# Patient Record
Sex: Female | Born: 1980 | Race: White | Hispanic: No | Marital: Married | Smoking: Never smoker
Health system: Southern US, Community
[De-identification: ages and names within clinical notes are randomized; demographics above are authoritative.]

## PROBLEM LIST (undated history)

## (undated) DIAGNOSIS — J45909 Unspecified asthma, uncomplicated: Secondary | ICD-10-CM

## (undated) DIAGNOSIS — Z87442 Personal history of urinary calculi: Secondary | ICD-10-CM

## (undated) DIAGNOSIS — E119 Type 2 diabetes mellitus without complications: Secondary | ICD-10-CM

## (undated) DIAGNOSIS — T7840XA Allergy, unspecified, initial encounter: Secondary | ICD-10-CM

## (undated) DIAGNOSIS — R519 Headache, unspecified: Secondary | ICD-10-CM

## (undated) DIAGNOSIS — F32A Depression, unspecified: Secondary | ICD-10-CM

## (undated) DIAGNOSIS — I10 Essential (primary) hypertension: Secondary | ICD-10-CM

## (undated) DIAGNOSIS — Z808 Family history of malignant neoplasm of other organs or systems: Secondary | ICD-10-CM

## (undated) DIAGNOSIS — N200 Calculus of kidney: Secondary | ICD-10-CM

## (undated) DIAGNOSIS — Z800 Family history of malignant neoplasm of digestive organs: Secondary | ICD-10-CM

## (undated) DIAGNOSIS — E669 Obesity, unspecified: Secondary | ICD-10-CM

## (undated) DIAGNOSIS — E785 Hyperlipidemia, unspecified: Secondary | ICD-10-CM

## (undated) DIAGNOSIS — O24419 Gestational diabetes mellitus in pregnancy, unspecified control: Secondary | ICD-10-CM

## (undated) DIAGNOSIS — O1490 Unspecified pre-eclampsia, unspecified trimester: Secondary | ICD-10-CM

## (undated) DIAGNOSIS — F909 Attention-deficit hyperactivity disorder, unspecified type: Secondary | ICD-10-CM

## (undated) DIAGNOSIS — F329 Major depressive disorder, single episode, unspecified: Secondary | ICD-10-CM

## (undated) DIAGNOSIS — Z803 Family history of malignant neoplasm of breast: Secondary | ICD-10-CM

## (undated) DIAGNOSIS — Z8042 Family history of malignant neoplasm of prostate: Secondary | ICD-10-CM

## (undated) HISTORY — DX: Family history of malignant neoplasm of digestive organs: Z80.0

## (undated) HISTORY — DX: Essential (primary) hypertension: I10

## (undated) HISTORY — DX: Family history of malignant neoplasm of prostate: Z80.42

## (undated) HISTORY — DX: Major depressive disorder, single episode, unspecified: F32.9

## (undated) HISTORY — PX: EYE SURGERY: SHX253

## (undated) HISTORY — DX: Hyperlipidemia, unspecified: E78.5

## (undated) HISTORY — DX: Family history of malignant neoplasm of other organs or systems: Z80.8

## (undated) HISTORY — DX: Depression, unspecified: F32.A

## (undated) HISTORY — DX: Type 2 diabetes mellitus without complications: E11.9

## (undated) HISTORY — DX: Family history of malignant neoplasm of breast: Z80.3

## (undated) HISTORY — DX: Obesity, unspecified: E66.9

## (undated) HISTORY — DX: Allergy, unspecified, initial encounter: T78.40XA

## (undated) HISTORY — DX: Calculus of kidney: N20.0

## (undated) HISTORY — DX: Unspecified asthma, uncomplicated: J45.909

## (undated) HISTORY — PX: TOOTH EXTRACTION: SUR596

---

## 2015-10-25 DIAGNOSIS — Z803 Family history of malignant neoplasm of breast: Secondary | ICD-10-CM | POA: Insufficient documentation

## 2015-10-25 DIAGNOSIS — Z8371 Family history of colonic polyps: Secondary | ICD-10-CM | POA: Insufficient documentation

## 2015-10-25 DIAGNOSIS — Z8632 Personal history of gestational diabetes: Secondary | ICD-10-CM | POA: Insufficient documentation

## 2015-10-25 DIAGNOSIS — Z8709 Personal history of other diseases of the respiratory system: Secondary | ICD-10-CM | POA: Insufficient documentation

## 2017-05-07 DIAGNOSIS — R4589 Other symptoms and signs involving emotional state: Secondary | ICD-10-CM | POA: Insufficient documentation

## 2017-05-07 DIAGNOSIS — F329 Major depressive disorder, single episode, unspecified: Secondary | ICD-10-CM

## 2018-05-01 ENCOUNTER — Ambulatory Visit (INDEPENDENT_AMBULATORY_CARE_PROVIDER_SITE_OTHER): Payer: 59 | Admitting: Family Medicine

## 2018-05-01 ENCOUNTER — Encounter: Payer: Self-pay | Admitting: Family Medicine

## 2018-05-01 VITALS — BP 136/86 | HR 64 | Temp 98.2°F | Ht 67.0 in | Wt 225.4 lb

## 2018-05-01 DIAGNOSIS — Z0000 Encounter for general adult medical examination without abnormal findings: Secondary | ICD-10-CM

## 2018-05-01 DIAGNOSIS — F329 Major depressive disorder, single episode, unspecified: Secondary | ICD-10-CM | POA: Diagnosis not present

## 2018-05-01 DIAGNOSIS — Z114 Encounter for screening for human immunodeficiency virus [HIV]: Secondary | ICD-10-CM | POA: Diagnosis not present

## 2018-05-01 DIAGNOSIS — Z975 Presence of (intrauterine) contraceptive device: Secondary | ICD-10-CM | POA: Insufficient documentation

## 2018-05-01 DIAGNOSIS — R4589 Other symptoms and signs involving emotional state: Secondary | ICD-10-CM

## 2018-05-01 LAB — CBC WITH DIFFERENTIAL/PLATELET
Basophils Absolute: 0 10*3/uL (ref 0.0–0.1)
Basophils Relative: 0.5 % (ref 0.0–3.0)
EOS ABS: 0.1 10*3/uL (ref 0.0–0.7)
Eosinophils Relative: 1.6 % (ref 0.0–5.0)
HEMATOCRIT: 43.8 % (ref 36.0–46.0)
HEMOGLOBIN: 14.6 g/dL (ref 12.0–15.0)
LYMPHS PCT: 33.3 % (ref 12.0–46.0)
Lymphs Abs: 2.9 10*3/uL (ref 0.7–4.0)
MCHC: 33.5 g/dL (ref 30.0–36.0)
MCV: 84.1 fl (ref 78.0–100.0)
MONO ABS: 0.4 10*3/uL (ref 0.1–1.0)
Monocytes Relative: 4.5 % (ref 3.0–12.0)
Neutro Abs: 5.3 10*3/uL (ref 1.4–7.7)
Neutrophils Relative %: 60.1 % (ref 43.0–77.0)
Platelets: 248 10*3/uL (ref 150.0–400.0)
RBC: 5.21 Mil/uL — AB (ref 3.87–5.11)
RDW: 13.2 % (ref 11.5–15.5)
WBC: 8.8 10*3/uL (ref 4.0–10.5)

## 2018-05-01 LAB — COMPREHENSIVE METABOLIC PANEL
ALBUMIN: 4.6 g/dL (ref 3.5–5.2)
ALK PHOS: 76 U/L (ref 39–117)
ALT: 16 U/L (ref 0–35)
AST: 15 U/L (ref 0–37)
BUN: 13 mg/dL (ref 6–23)
CALCIUM: 9.4 mg/dL (ref 8.4–10.5)
CO2: 30 mEq/L (ref 19–32)
CREATININE: 0.7 mg/dL (ref 0.40–1.20)
Chloride: 105 mEq/L (ref 96–112)
GFR: 98.82 mL/min (ref >60.00)
Glucose, Bld: 88 mg/dL (ref 70–99)
Potassium: 3.8 mEq/L (ref 3.5–5.1)
SODIUM: 141 meq/L (ref 135–145)
TOTAL PROTEIN: 7.5 g/dL (ref 6.0–8.3)
Total Bilirubin: 0.6 mg/dL (ref 0.2–1.2)

## 2018-05-01 LAB — LIPID PANEL
CHOLESTEROL: 210 mg/dL — AB (ref 0–200)
HDL: 37.8 mg/dL — AB (ref >39.00)
LDL Cholesterol: 140 mg/dL — ABNORMAL HIGH (ref 0–99)
NonHDL: 172.35
TRIGLYCERIDES: 160 mg/dL — AB (ref 0.0–149.0)
Total CHOL/HDL Ratio: 6
VLDL: 32 mg/dL (ref 0.0–40.0)

## 2018-05-01 LAB — TSH: TSH: 2.33 u[IU]/mL (ref 0.35–4.50)

## 2018-05-01 MED ORDER — SERTRALINE HCL 50 MG PO TABS: 50.0000 mg | ORAL_TABLET | Freq: Every day | ORAL | 3 refills | Status: DC

## 2018-05-01 MED ORDER — MONTELUKAST SODIUM 10 MG PO TABS: 10.0000 mg | ORAL_TABLET | Freq: Every day | ORAL | 3 refills | Status: DC

## 2018-05-01 MED FILL — SERTRALINE HCL 50 MG TABLET: 50 | 90 days supply | Qty: 90 | Fill #0

## 2018-05-01 MED FILL — MONTELUKAST SOD 10 MG TAB: 10 | 90 days supply | Qty: 90 | Fill #0

## 2018-05-01 NOTE — Patient Instructions (Signed)
Health Maintenance Due  Topic Date Due  . PAP SMEAR -due in October 2019 06/09/2016  . INFLUENZA VACCINE -will get through [facility] this month 03/26/2018

## 2018-05-01 NOTE — Progress Notes (Addendum)
Patient: Stephanie Fields MRN: [id number] DOB: [birth year] PCP: Orma Flaming, MD     Subjective:  Chief Complaint  Patient presents with  . Establish Care    HPI: The patient is a 37 y.o. female who presents today for annual exam. She denies any changes to past medical history. There have been no recent hospitalizations. They are following a well balanced diet and exercise plan. Weight has been stable. No complaints today. She is working on exercise and diet as she would like to lose weight.   Depression: history of depression. She was diagnosed over a year ago. Did well on Wellbutrin until had adverse side effects with hives. Was changed to zoloft and doing well on current dose. Very well controlled. NO desire to change. Needing refills. Trying to work on exercise.  Immunization History  Administered Date(s) Administered  . Influenza-Unspecified 05/22/2016, 05/15/2017  . Tdap 10/24/2012     Colonoscopy: age 64 Hx of adenoma's in her dad.  Mammogram: needs at 38.  Pap smear: 05/2013. Normal no hx of abnormal pap smear.  Tdap: 2014  Review of Systems  Constitutional: Positive for fatigue. Negative for chills and fever.  HENT: Negative for dental problem, ear pain, hearing loss and trouble swallowing.   Eyes: Negative for visual disturbance.  Respiratory: Negative for cough, chest tightness and shortness of breath.   Cardiovascular: Negative for chest pain, palpitations and leg swelling.  Gastrointestinal: Negative for abdominal pain, blood in stool, diarrhea and nausea.  Endocrine: Negative for cold intolerance, polydipsia, polyphagia and polyuria.  Genitourinary: Negative for dysuria and hematuria.  Musculoskeletal: Negative for arthralgias.  Skin: Negative for rash.  Neurological: Negative for dizziness and headaches.  Psychiatric/Behavioral: Negative for dysphoric mood and sleep disturbance. The patient is not nervous/anxious.     Allergies Patient is allergic to  wellbutrin [bupropion].  Past Medical History Patient  has a past medical history of Depression.  Surgical History Patient  has a past surgical history that includes Tooth extraction.  Family History Pateint's family history includes Alcohol abuse in her maternal grandfather and mother; Alzheimer's disease in her paternal grandfather; Arthritis in her paternal grandmother; COPD in her mother; Cancer in her father, maternal aunt, maternal aunt, and maternal grandmother; Depression in her brother, brother, father, mother, sister, and sister; Diabetes in her father, maternal grandfather, and mother; Early death in her mother; Hearing loss in her father and paternal grandmother; Heart attack in her maternal grandfather and paternal grandmother; Heart disease in her maternal grandfather, maternal grandmother, paternal grandfather, and paternal grandmother; Hyperlipidemia in her father, maternal grandfather, mother, paternal grandfather, and paternal grandmother; Hypertension in her father, maternal grandfather, and mother; Hypothyroidism in her mother; Learning disabilities in her daughter; Mental illness in her mother.  Social History Patient  reports that she has never smoked. She has never used smokeless tobacco. She reports that she drinks alcohol.    Objective: Vitals:   05/01/18 1317  BP: 136/86  Pulse: 64  Temp: 98.2 F (36.8 C)  TempSrc: Oral  SpO2: 95%  Weight: 225 lb 6.4 oz (102.2 kg)  Height: 5\' 7"  (1.702 m)    Body mass index is 35.3 kg/m.  Physical Exam  Constitutional: She is oriented to person, place, and time. She appears well-developed and well-nourished.  HENT:  Right Ear: External ear normal.  Left Ear: External ear normal.  Mouth/Throat: Oropharynx is clear and moist. No oropharyngeal exudate.  Tm pearly with light reflex bilaterally   Eyes: Pupils are equal, round, and reactive  to light. Conjunctivae and EOM are normal.  Neck: Normal range of motion. Neck supple.  No thyromegaly present.  Cardiovascular: Normal rate, regular rhythm, normal heart sounds and intact distal pulses.  No murmur heard. Pulmonary/Chest: Effort normal and breath sounds normal.  Abdominal: Soft. Bowel sounds are normal. She exhibits no distension. There is no tenderness.  Lymphadenopathy:    She has no cervical adenopathy.  Neurological: She is alert and oriented to person, place, and time. She displays normal reflexes. No cranial nerve deficit. Coordination normal.  Skin: Skin is warm and dry. No rash noted.  Psychiatric: She has a normal mood and affect. Her behavior is normal.  Vitals reviewed.      Assessment/plan: 1. Annual physical exam utd on health maintenance except for pap smear. Routine labs today. Will go to gyn as she is due for new IUD. Can do her pap and replace IUD. mmg and cscope at age 64 years. Wears sunscreen and does skin checks. Working on weight loss and exercise. F/u in one year or as needed.   - Comprehensive metabolic panel - CBC with Differential/Platelet - TSH - Lipid panel  2. Encounter for screening for HIV  - HIV antibody  3. Depressed mood Well controlled on her zoloft. Continue current medication. Refills given. F/u in one year or as needed.      Return in about 1 year (around 05/02/2019).    Orma Flaming, MD [place]  05/01/2018

## 2018-05-02 LAB — HIV ANTIBODY (ROUTINE TESTING W REFLEX): HIV 1&2 Ab, 4th Generation: NONREACTIVE

## 2018-08-04 MED FILL — MONTELUKAST SOD 10 MG TAB: 10 | 90 days supply | Qty: 90 | Fill #1

## 2018-08-04 MED FILL — SERTRALINE HCL 50 MG TABLET: 50 | 90 days supply | Qty: 90 | Fill #1

## 2018-11-10 MED FILL — MONTELUKAST SOD 10 MG TAB: 10 | 90 days supply | Qty: 90 | Fill #2

## 2018-11-10 MED FILL — SERTRALINE HCL 50 MG TABLET: 50 | 90 days supply | Qty: 90 | Fill #2

## 2018-11-13 ENCOUNTER — Other Ambulatory Visit: Payer: Self-pay | Admitting: Family Medicine

## 2018-11-13 MED ORDER — OSELTAMIVIR PHOSPHATE 75 MG PO CAPS: 75.0000 mg | ORAL_CAPSULE | Freq: Every day | ORAL | 0 refills | Status: AC

## 2018-11-13 MED FILL — OSELTAMIVIR PHOSPHATE 75 MG: 75 | 10 days supply | Qty: 10 | Fill #0

## 2018-11-13 NOTE — Progress Notes (Signed)
Son tested positive for flu B. Symptom onset <24 hours. Treating prophylactically per patient request.   Orma Flaming, MD [place]

## 2019-02-15 MED FILL — MONTELUKAST SOD 10 MG TAB: 10 | 90 days supply | Qty: 90 | Fill #3

## 2019-02-15 MED FILL — SERTRALINE HCL 50 MG TABLET: 50 | 90 days supply | Qty: 90 | Fill #3

## 2019-05-05 ENCOUNTER — Encounter: Payer: 59 | Admitting: Family Medicine

## 2019-05-14 ENCOUNTER — Other Ambulatory Visit: Payer: Self-pay

## 2019-05-14 ENCOUNTER — Encounter: Payer: Self-pay | Admitting: Family Medicine

## 2019-05-14 ENCOUNTER — Ambulatory Visit (INDEPENDENT_AMBULATORY_CARE_PROVIDER_SITE_OTHER): Payer: 59 | Admitting: Family Medicine

## 2019-05-14 DIAGNOSIS — Z23 Encounter for immunization: Secondary | ICD-10-CM

## 2019-05-14 DIAGNOSIS — F329 Major depressive disorder, single episode, unspecified: Secondary | ICD-10-CM | POA: Diagnosis not present

## 2019-05-14 DIAGNOSIS — Z8371 Family history of colonic polyps: Secondary | ICD-10-CM | POA: Diagnosis not present

## 2019-05-14 DIAGNOSIS — R4589 Other symptoms and signs involving emotional state: Secondary | ICD-10-CM

## 2019-05-14 DIAGNOSIS — Z0000 Encounter for general adult medical examination without abnormal findings: Secondary | ICD-10-CM

## 2019-05-14 NOTE — Patient Instructions (Addendum)
GYN recommendations -dr. Sumner Boast or suzanne miller  -dr. Aloha Gell with [facility]    zoloft wean:  Would do 1/2 pill x 2-3 weeks then every other day for 2 weeks then stop.  If don't do well then go back on, but hopefully you can stop.   SO FUN TO SEE YOU!!!    Preventive Care 61-40 Years Old, Female Preventive care refers to visits with your health care provider and lifestyle choices that can promote health and wellness. This includes:  A yearly physical exam. This may also be called an annual well check.  Regular dental visits and eye exams.  Immunizations.  Screening for certain conditions.  Healthy lifestyle choices, such as eating a healthy diet, getting regular exercise, not using drugs or products that contain nicotine and tobacco, and limiting alcohol use. What can I expect for my preventive care visit? Physical exam Your health care provider will check your:  Height and weight. This may be used to calculate body mass index (BMI), which tells if you are at a healthy weight.  Heart rate and blood pressure.  Skin for abnormal spots. Counseling Your health care provider may ask you questions about your:  Alcohol, tobacco, and drug use.  Emotional well-being.  Home and relationship well-being.  Sexual activity.  Eating habits.  Work and work Statistician.  Method of birth control.  Menstrual cycle.  Pregnancy history. What immunizations do I need?  Influenza (flu) vaccine  This is recommended every year. Tetanus, diphtheria, and pertussis (Tdap) vaccine  You may need a Td booster every 10 years. Varicella (chickenpox) vaccine  You may need this if you have not been vaccinated. Zoster (shingles) vaccine  You may need this after age 20. Measles, mumps, and rubella (MMR) vaccine  You may need at least one dose of MMR if you were born in 1957 or later. You may also need a second dose. Pneumococcal conjugate (PCV13) vaccine  You  may need this if you have certain conditions and were not previously vaccinated. Pneumococcal polysaccharide (PPSV23) vaccine  You may need one or two doses if you smoke cigarettes or if you have certain conditions. Meningococcal conjugate (MenACWY) vaccine  You may need this if you have certain conditions. Hepatitis A vaccine  You may need this if you have certain conditions or if you travel or work in places where you may be exposed to hepatitis A. Hepatitis B vaccine  You may need this if you have certain conditions or if you travel or work in places where you may be exposed to hepatitis B. Haemophilus influenzae type b (Hib) vaccine  You may need this if you have certain conditions. Human papillomavirus (HPV) vaccine  If recommended by your health care provider, you may need three doses over 6 months. You may receive vaccines as individual doses or as more than one vaccine together in one shot (combination vaccines). Talk with your health care provider about the risks and benefits of combination vaccines. What tests do I need? Blood tests  Lipid and cholesterol levels. These may be checked every 5 years, or more frequently if you are over 30 years old.  Hepatitis C test.  Hepatitis B test. Screening  Lung cancer screening. You may have this screening every year starting at age 61 if you have a 30-pack-year history of smoking and currently smoke or have quit within the past 15 years.  Colorectal cancer screening. All adults should have this screening starting at age 12 and continuing  until age 46. Your health care provider may recommend screening at age 45 if you are at increased risk. You will have tests every 1-10 years, depending on your results and the type of screening test.  Diabetes screening. This is done by checking your blood sugar (glucose) after you have not eaten for a while (fasting). You may have this done every 1-3 years.  Mammogram. This may be done every 1-2  years. Talk with your health care provider about when you should start having regular mammograms. This may depend on whether you have a family history of breast cancer.  BRCA-related cancer screening. This may be done if you have a family history of breast, ovarian, tubal, or peritoneal cancers.  Pelvic exam and Pap test. This may be done every 3 years starting at age 54. Starting at age 81, this may be done every 5 years if you have a Pap test in combination with an HPV test. Other tests  Sexually transmitted disease (STD) testing.  Bone density scan. This is done to screen for osteoporosis. You may have this scan if you are at high risk for osteoporosis. Follow these instructions at home: Eating and drinking  Eat a diet that includes fresh fruits and vegetables, whole grains, lean protein, and low-fat dairy.  Take vitamin and mineral supplements as recommended by your health care provider.  Do not drink alcohol if: ? Your health care provider tells you not to drink. ? You are pregnant, may be pregnant, or are planning to become pregnant.  If you drink alcohol: ? Limit how much you have to 0-1 drink a day. ? Be aware of how much alcohol is in your drink. In the [place] one drink equals one 12 oz bottle of beer (355 mL), one 5 oz glass of wine (148 mL), or one 1 oz glass of hard liquor (44 mL). Lifestyle  Take daily care of your teeth and gums.  Stay active. Exercise for at least 30 minutes on 5 or more days each week.  Do not use any products that contain nicotine or tobacco, such as cigarettes, e-cigarettes, and chewing tobacco. If you need help quitting, ask your health care provider.  If you are sexually active, practice safe sex. Use a condom or other form of birth control (contraception) in order to prevent pregnancy and STIs (sexually transmitted infections).  If told by your health care provider, take low-dose aspirin daily starting at age 18. What's next?  Visit your  health care provider once a year for a well check visit.  Ask your health care provider how often you should have your eyes and teeth checked.  Stay up to date on all vaccines. This information is not intended to replace advice given to you by your health care provider. Make sure you discuss any questions you have with your health care provider. Document Released: 09/08/2015 Document Revised: 04/23/2018 Document Reviewed: 04/23/2018 Elsevier Patient Education  2020 [employer].

## 2019-05-14 NOTE — Progress Notes (Signed)
Patient: Stephanie Fields MRN: [id number] DOB: [birth year] PCP: Orma Flaming, MD     Subjective:    Chief Complaint  Patient presents with  . Annual Exam    HPI: The patient is a 38 y.o. female who presents today for annual exam. She denies any changes to past medical history. There have been no recent hospitalizations. They are following a well balanced diet and exercise plan. Weight has been stable. No complaints today.   Family history of tubular adenomas in her father when he was in his 21s. She is due for colonoscopy this year. Referral will be placed.   Depression: currently on zoloft and is well controlled. She is interested in weaning   Immunization History  Administered Date(s) Administered  . Influenza,inj,Quad PF,6+ Mos 05/14/2019  . Influenza-Unspecified 05/22/2016, 05/15/2017  . Tdap 10/24/2012   Colonoscopy: needs cscope ordered today  Mammogram: number given  Pap smear: will do with mirena.  Tdap: 12/08/2012 Flu shot: today    Review of Systems  Constitutional: Negative for chills, fatigue and fever.  HENT: Negative for congestion, dental problem, ear pain, hearing loss, postnasal drip, rhinorrhea, sore throat and trouble swallowing.   Eyes: Negative for visual disturbance.  Respiratory: Negative for cough, chest tightness and shortness of breath.   Cardiovascular: Negative for chest pain, palpitations and leg swelling.  Gastrointestinal: Negative for abdominal pain, blood in stool, diarrhea, nausea and vomiting.  Endocrine: Negative for cold intolerance, polydipsia, polyphagia and polyuria.  Genitourinary: Negative for dysuria, frequency, hematuria and urgency.  Musculoskeletal: Negative for arthralgias, back pain, myalgias and neck pain.  Skin: Negative for rash.  Neurological: Negative for dizziness and headaches.  Psychiatric/Behavioral: Negative for dysphoric mood and sleep disturbance. The patient is not nervous/anxious.      Allergies Patient is allergic to bupropion.  Past Medical History Patient  has a past medical history of Depression.  Surgical History Patient  has a past surgical history that includes Tooth extraction.  Family History Pateint's family history includes Alcohol abuse in her maternal grandfather and mother; Alzheimer's disease in her paternal grandfather; Arthritis in her paternal grandmother; COPD in her mother; Cancer in her father, maternal aunt, maternal aunt, and maternal grandmother; Depression in her brother, brother, father, mother, sister, and sister; Diabetes in her father, maternal grandfather, and mother; Early death in her mother; Hearing loss in her father and paternal grandmother; Heart attack in her maternal grandfather and paternal grandmother; Heart disease in her maternal grandfather, maternal grandmother, paternal grandfather, and paternal grandmother; Hyperlipidemia in her father, maternal grandfather, mother, paternal grandfather, and paternal grandmother; Hypertension in her father, maternal grandfather, and mother; Hypothyroidism in her mother; Learning disabilities in her daughter; Mental illness in her mother.  Social History Patient  reports that she has never smoked. She has never used smokeless tobacco. She reports current alcohol use.    Objective: Vitals:   05/14/19 1404  BP: 124/78  Pulse: 65  Temp: 98.3 F (36.8 C)  TempSrc: Skin  SpO2: 98%  Weight: 226 lb (102.5 kg)  Height: 5\' 7"  (1.702 m)    Body mass index is 35.4 kg/m.  Physical Exam Vitals signs reviewed.  Constitutional:      Appearance: Normal appearance. She is well-developed. She is obese.  HENT:     Head: Normocephalic and atraumatic.     Right Ear: Tympanic membrane, ear canal and external ear normal.     Left Ear: Tympanic membrane, ear canal and external ear normal.     Nose: Nose  normal.     Mouth/Throat:     Mouth: Mucous membranes are moist.  Eyes:     Extraocular  Movements: Extraocular movements intact.     Conjunctiva/sclera: Conjunctivae normal.     Pupils: Pupils are equal, round, and reactive to light.  Neck:     Musculoskeletal: Normal range of motion and neck supple.     Thyroid: No thyromegaly.  Cardiovascular:     Rate and Rhythm: Normal rate and regular rhythm.     Pulses: Normal pulses.     Heart sounds: Normal heart sounds. No murmur.  Pulmonary:     Effort: Pulmonary effort is normal.     Breath sounds: Normal breath sounds.  Abdominal:     General: Abdomen is flat. Bowel sounds are normal. There is no distension.     Palpations: Abdomen is soft.     Tenderness: There is no abdominal tenderness.  Musculoskeletal: Normal range of motion.  Lymphadenopathy:     Cervical: No cervical adenopathy.  Skin:    General: Skin is warm and dry.     Capillary Refill: Capillary refill takes less than 2 seconds.     Findings: No rash.  Neurological:     General: No focal deficit present.     Mental Status: She is alert and oriented to person, place, and time.     Cranial Nerves: No cranial nerve deficit.     Coordination: Coordination normal.     Deep Tendon Reflexes: Reflexes normal.  Psychiatric:        Mood and Affect: Mood normal.        Behavior: Behavior normal.        Depression screen [facility] 2/9 05/14/2019 05/01/2018  Decreased Interest 0 0  Down, Depressed, Hopeless 0 0  PHQ - 2 Score 0 0  Altered sleeping 1 0  Tired, decreased energy 1 1  Change in appetite 1 3  Feeling bad or failure about yourself  0 0  Trouble concentrating 0 0  Moving slowly or fidgety/restless 0 0  Suicidal thoughts 0 0  PHQ-9 Score 3 4  Difficult doing work/chores Not difficult at all Not difficult at all    Assessment/plan: 1. Annual physical exam Routine labs today. Continue diet/weight loss and exercise. She will get pap and mirena with gyn. Flu shot today and  mmg number given. Referral for scope done as well with family hx of numerous adenoma  polyps in dad at age 54. F/u in one year or as needed.  Patient counseling [x]    Nutrition: Stressed importance of moderation in sodium/caffeine intake, saturated fat and cholesterol, caloric balance, sufficient intake of fresh fruits, vegetables, fiber, calcium, iron, and 1 mg of folate supplement per day (for females capable of pregnancy).  [x]    Stressed the importance of regular exercise.   []    Substance Abuse: Discussed cessation/primary prevention of tobacco, alcohol, or other drug use; driving or other dangerous activities under the influence; availability of treatment for abuse.   [x]    Injury prevention: Discussed safety belts, safety helmets, smoke detector, smoking near bedding or upholstery.   [x]    Sexuality: Discussed sexually transmitted diseases, partner selection, use of condoms, avoidance of unintended pregnancy  and contraceptive alternatives.  [x]    Dental health: Discussed importance of regular tooth brushing, flossing, and dental visits.  [x]    Health maintenance and immunizations reviewed. Please refer to Health maintenance section.    - CBC with Differential/Platelet - Comprehensive metabolic panel - Lipid panel -  TSH - VITAMIN D 25 Hydroxy (Vit-D Deficiency, Fractures)  2. Family history of polyps in the colon  - Ambulatory referral to Gastroenterology  3. Depressed mood Will wean off the zoloft. She was put on after death of her mother and feels like she is doing fine. Discussed appropriate wean with her. Let me know if not doing well after wean or can restart if feels like her depression symptoms are not controlled. Sounds like this was more situational.   4. Need for immunization against influenza  - Flu Vaccine QUAD 36+ mos IM    Return in about 1 year (around 05/13/2020).    Orma Flaming, MD [place]  05/14/2019

## 2019-05-15 LAB — CBC WITH DIFFERENTIAL/PLATELET
Absolute Monocytes: 408 cells/uL (ref 200–950)
Basophils Absolute: 94 cells/uL (ref 0–200)
Basophils Relative: 1.1 %
Eosinophils Absolute: 213 cells/uL (ref 15–500)
Eosinophils Relative: 2.5 %
HCT: 43.1 % (ref 35.0–45.0)
Hemoglobin: 14.2 g/dL (ref 11.7–15.5)
Lymphs Abs: 2839 cells/uL (ref 850–3900)
MCH: 28 pg (ref 27.0–33.0)
MCHC: 32.9 g/dL (ref 32.0–36.0)
MCV: 85 fL (ref 80.0–100.0)
MPV: 11.1 fL (ref 7.5–12.5)
Monocytes Relative: 4.8 %
Neutro Abs: 4947 cells/uL (ref 1500–7800)
Neutrophils Relative %: 58.2 %
Platelets: 258 10*3/uL (ref 140–400)
RBC: 5.07 10*6/uL (ref 3.80–5.10)
RDW: 12.6 % (ref 11.0–15.0)
Total Lymphocyte: 33.4 %
WBC: 8.5 10*3/uL (ref 3.8–10.8)

## 2019-05-15 LAB — COMPREHENSIVE METABOLIC PANEL
AG Ratio: 1.9 (calc) (ref 1.0–2.5)
ALT: 11 U/L (ref 6–29)
AST: 12 U/L (ref 10–30)
Albumin: 4.5 g/dL (ref 3.6–5.1)
Alkaline phosphatase (APISO): 76 U/L (ref 31–125)
BUN: 15 mg/dL (ref 7–25)
CO2: 23 mmol/L (ref 20–32)
Calcium: 9.5 mg/dL (ref 8.6–10.2)
Chloride: 105 mmol/L (ref 98–110)
Creat: 0.74 mg/dL (ref 0.50–1.10)
Globulin: 2.4 g/dL (calc) (ref 1.9–3.7)
Glucose, Bld: 84 mg/dL (ref 65–99)
Potassium: 3.7 mmol/L (ref 3.5–5.3)
Sodium: 142 mmol/L (ref 135–146)
Total Bilirubin: 0.6 mg/dL (ref 0.2–1.2)
Total Protein: 6.9 g/dL (ref 6.1–8.1)

## 2019-05-15 LAB — TSH: TSH: 1.18 mIU/L

## 2019-05-15 LAB — LIPID PANEL
Cholesterol: 223 mg/dL — ABNORMAL HIGH (ref <200)
HDL: 38 mg/dL — ABNORMAL LOW (ref >=50)
LDL Cholesterol (Calc): 153 mg/dL (calc) — ABNORMAL HIGH
Non-HDL Cholesterol (Calc): 185 mg/dL (calc) — ABNORMAL HIGH (ref <130)
Total CHOL/HDL Ratio: 5.9 (calc) — ABNORMAL HIGH (ref <5.0)
Triglycerides: 182 mg/dL — ABNORMAL HIGH (ref <150)

## 2019-05-15 LAB — VITAMIN D 25 HYDROXY (VIT D DEFICIENCY, FRACTURES): Vit D, 25-Hydroxy: 25 ng/mL — ABNORMAL LOW (ref 30–100)

## 2019-05-17 ENCOUNTER — Encounter: Payer: Self-pay | Admitting: Gastroenterology

## 2019-05-17 ENCOUNTER — Other Ambulatory Visit: Payer: Self-pay | Admitting: Family Medicine

## 2019-05-17 DIAGNOSIS — E559 Vitamin D deficiency, unspecified: Secondary | ICD-10-CM | POA: Insufficient documentation

## 2019-05-17 MED ORDER — VITAMIN D (ERGOCALCIFEROL) 1.25 MG (50000 UNIT) PO CAPS: ORAL_CAPSULE | ORAL | 0 refills | Status: DC

## 2019-05-17 MED FILL — VIT D2 1.25 MG (50,000 UNIT: 1.25 MG | 56 days supply | Qty: 8 | Fill #0

## 2019-05-25 ENCOUNTER — Other Ambulatory Visit: Payer: Self-pay

## 2019-05-25 ENCOUNTER — Encounter: Payer: Self-pay | Admitting: Family Medicine

## 2019-05-25 MED ORDER — MONTELUKAST SODIUM 10 MG PO TABS: 10.0000 mg | ORAL_TABLET | Freq: Every day | ORAL | 3 refills | Status: DC

## 2019-06-16 ENCOUNTER — Ambulatory Visit (INDEPENDENT_AMBULATORY_CARE_PROVIDER_SITE_OTHER): Payer: 59 | Admitting: Gastroenterology

## 2019-06-16 ENCOUNTER — Encounter: Payer: Self-pay | Admitting: Gastroenterology

## 2019-06-16 VITALS — BP 128/82 | HR 73 | Temp 98.5°F | Ht 67.0 in | Wt 228.0 lb

## 2019-06-16 DIAGNOSIS — Z8371 Family history of colonic polyps: Secondary | ICD-10-CM

## 2019-06-16 DIAGNOSIS — Z1159 Encounter for screening for other viral diseases: Secondary | ICD-10-CM | POA: Diagnosis not present

## 2019-06-16 MED ORDER — NA SULFATE-K SULFATE-MG SULF 17.5-3.13-1.6 GM/177ML PO SOLN: 1.0000 | ORAL | 0 refills | Status: DC

## 2019-06-16 NOTE — Patient Instructions (Addendum)
It's time for a colonoscopy given your family history.  I recommend using a stool bulking agent such as Metamucil, Benefiber, or Citrucel.  Tips for colonoscopy:  - Stay well hydrated for 3-4 days prior to the exam. This reduces nausea and dehydration.  - To prevent skin/hemorrhoid irritation - prior to wiping, put A&Dointment or vaseline on the toilet paper. - Keep a towel or pad on the bed.  - Drink  64oz of clear liquids in the morning of prep day (prior to starting the prep) to be sure that there is enough fluid to flush the colon and stay hydrated!!!! This is in addition to the fluids required for preparation. - Use of a flavored hard candy, such as grape Anise Salvo, can counteract some of the flavor of the prep and may prevent some nausea.   Please call, text, or email me with any questions prior to your colonoscopy - including problems with your bowel prep. My personal cell phone is [phone].  Due to recent COVID-19 restrictions implemented by [employer] and state authorities and in an effort to keep both patients and staff as safe as possible, [place] requires COVID-19 testing prior to any scheduled endoscopic procedure. The testing center is located at [address] [place], [place] [address] in the [facility] [employer]  suite.  Your appointment has been scheduled for 8:00 am on 07-13-2019.   Please bring your insurance cards to this appointment. You will require your COVID screen 2 business days prior to your endoscopic procedure.  You are not required to quarantine after your screening.  You will only receive a phone call with the results if it is POSITIVE.  If you do not receive a call the day before your procedure you should begin your prep, if ordered, and you should report to the endo center for your procedure at your designated appointment arrival time ( one hour prior to the procedure time). There is no cost to you  for the screening on the day of the swab.  [facility] will file with your insurance company for the testing.    You may receive an automated phone call prior to your procedure or have a message in your MyChart that you have an appointment for a BP/15 at the [facility], please disregard this message.  Your testing will be at the [place] [place] location.   If you are leaving [facility] travel [place] on [address], turn left onto [address], turn night onto [address] at the 1st stop light turn right, pass the [employer] on your right and proceed to [address] (white building).

## 2019-06-16 NOTE — Progress Notes (Addendum)
Referring Provider: Orma Flaming, MD Primary Care Physician:  Orma Flaming, MD  Reason for Consultation:  Family history of colon polyps   IMPRESSION:  Family history of colon polyps (father with tubular adenomas in his 26s) Stress-induced diarrhea    - treated with Bentyl in college Single episode of nocturnal fecal incontinence 2020 Hemorrhoids with occasional rectal bleeding No prior colon cancer screening Acute bloody diarrhea attributed to likely Norwalk virus 2002 Family history of celiac (Paternal aunt and three paternal first degree cousins)  Given her family history a colonoscopy is recommended for colon cancer screening.  She has a longstanding history of intermittent diarrhea associated with stress and caffeine.  There is been a recent episode of nocturnal fecal incontinence of unclear etiology.  She also has occasional rectal bleeding that she is attributed to hemorrhoids.  I recommended adding a stool bulking agent.  Could consider repeating a trial of Bentyl if the symptoms become more severe.  We will pursue additional evaluation of the fecal incontinence beyond colonoscopy with any recurrence.   PLAN: Add a daily stool bulking agent such as Metamucil, Benefiber, or Citrucel Colonoscopy recommended given the family history of colon cancer and to confirm hemorrhoids as the cause of rectal bleeding  Please see the "Patient Instructions" section for addition details about the plan.  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 The history is obtained through the patient and review of her electronic health record.  She has borderline hyperlipidemia, vitamin D deficiency, depression, hypertension during pregnancy, gestational diabetes, and obesity.  Father with tubular adenomas in his 26s. Required annual colonoscopy. Multiple right sided polyps required hemicolectomy at age 24. He has had some  additional polyps since that time.   Mutiple polyps in her father's side of the family. Paternal uncle with metastatic pancreatic cancer in his early 74s. Mother's family with breast cancer. No other known family history of colon cancer or polyps. No family history of uterine/endometrial cancer, pancreatic cancer or gastric/stomach cancer.  Hospitalized in 2002 with acute bloody diarrhea that was likely Norwalk while living in [place]. Insurance denied colonoscopy at that time.  Symptoms resolved.   Longstanding symptoms of diarrhea related to stress and caffeine. No blood or mucous. Difficult when traveling across country. Preceeded by cramping.  Bentyl in college provided no significant improvement.  Sugar and dairy.   Ocasional sandy stools. One episode of fecal incontinence awoke with soiled bedsheets and underwear. Normally has a sense of incomplete evacuation.   Morning formed stool followed by several loose bowel movements daily. Has to rotate her body to get her BM started. Some associated rectal fullness.  Associated borborymous.   Hemorrhoids since the time of her first child. Rare associated bleeding with significant diarrhea. No pain.    Paternal aunt and three paternal first degree cousins with celiac disease and gluten intoleance.    Past Medical History:  Diagnosis Date  . Depression     Past Surgical History:  Procedure Laterality Date  . TOOTH EXTRACTION      Current Outpatient Medications  Medication Sig Dispense Refill  . levonorgestrel (MIRENA) 20 MCG/24HR IUD by Intrauterine route.    . montelukast (SINGULAIR) 10 MG tablet Take 1 tablet (10 mg total) by mouth at bedtime. 90 tablet 3  . Vitamin D, Ergocalciferol, (DRISDOL) 1.25 MG (50000 UT) CAPS capsule One capsule by mouth once a week for 8 weeks. Then take 1000IU/day 8 capsule  0   No current facility-administered medications for this visit.     Allergies as of 06/16/2019 - Review Complete 05/14/2019   Allergen Reaction Noted  . Bupropion Hives and Rash 05/07/2017    Family History  Problem Relation Age of Onset  . Early death Mother   . Alcohol abuse Mother   . COPD Mother   . Depression Mother   . Diabetes Mother   . Hyperlipidemia Mother   . Hypertension Mother   . Mental illness Mother   . Hypothyroidism Mother   . Cancer Father        prostate cancer  . Depression Father   . Diabetes Father   . Hearing loss Father   . Hyperlipidemia Father   . Hypertension Father   . Depression Sister   . Depression Brother   . Learning disabilities Daughter        Autism  . Cancer Maternal Aunt        breast ca  . Cancer Maternal Grandmother        thyroid ca, breast ca,squamous ca, basal cell ca, melanoma  . Heart disease Maternal Grandmother   . Alcohol abuse Maternal Grandfather   . Diabetes Maternal Grandfather   . Heart attack Maternal Grandfather   . Heart disease Maternal Grandfather   . Hyperlipidemia Maternal Grandfather   . Hypertension Maternal Grandfather   . Arthritis Paternal Grandmother   . Hearing loss Paternal Grandmother   . Heart attack Paternal Grandmother   . Heart disease Paternal Grandmother   . Hyperlipidemia Paternal Grandmother   . Heart disease Paternal Grandfather   . Hyperlipidemia Paternal Grandfather   . Alzheimer's disease Paternal Grandfather   . Depression Sister   . Depression Brother   . Cancer Maternal Aunt        breast ca    Social History   Socioeconomic History  . Marital status: Married    Spouse name: Not on file  . Number of children: Not on file  . Years of education: Not on file  . Highest education level: Not on file  Occupational History  . Not on file  Social Needs  . Financial resource strain: Not on file  . Food insecurity    Worry: Not on file    Inability: Not on file  . Transportation needs    Medical: Not on file    Non-medical: Not on file  Tobacco Use  . Smoking status: Never Smoker  . Smokeless  tobacco: Never Used  Substance and Sexual Activity  . Alcohol use: Yes    Comment: rarely  . Drug use: Not on file  . Sexual activity: Yes  Lifestyle  . Physical activity    Days per week: Not on file    Minutes per session: Not on file  . Stress: Not on file  Relationships  . Social Herbalist on phone: Not on file    Gets together: Not on file    Attends religious service: Not on file    Active member of club or organization: Not on file    Attends meetings of clubs or organizations: Not on file    Relationship status: Not on file  . Intimate partner violence    Fear of current or ex partner: Not on file    Emotionally abused: Not on file    Physically abused: Not on file    Forced sexual activity: Not on file  Other Topics Concern  . Not  on file  Social History Narrative  . Not on file    Review of Systems: 12 system ROS is negative except as noted above.   Physical Exam: General:   Alert,  well-nourished, pleasant and cooperative in NAD Head:  Normocephalic and atraumatic. Eyes:  Sclera clear, no icterus.   Conjunctiva pink. Ears:  Normal auditory acuity. Nose:  No deformity, discharge,  or lesions. Mouth:  No deformity or lesions.   Neck:  Supple; no masses or thyromegaly. Lungs:  Clear throughout to auscultation.   No wheezes. Heart:  Regular rate and rhythm; no murmurs. Abdomen:  Soft,nontender, nondistended, normal bowel sounds, no rebound or guarding. No hepatosplenomegaly.   Rectal:  Deferred  Msk:  Symmetrical. No boney deformities LAD: No inguinal or umbilical LAD Extremities:  No clubbing or edema. Neurologic:  Alert and  oriented x4;  grossly nonfocal Skin:  Intact without significant lesions or rashes. Psych:  Alert and cooperative. Normal mood and affect.   Lab Results: No results for input(s): WBC, HGB, HCT, PLT in the last 72 hours. BMET No results for input(s): NA, K, CL, CO2, GLUCOSE, BUN, CREATININE, CALCIUM in the last 72 hours.  LFT No results for input(s): PROT, ALBUMIN, AST, ALT, ALKPHOS, BILITOT, BILIDIR, IBILI in the last 72 hours. PT/INR No results for input(s): LABPROT, INR in the last 72 hours. Hepatitis Panel No results for input(s): HEPBSAG, HCVAB, HEPAIGM, HEPBIGM in the last 72 hours.    Studies/Results: No results found.    Kaleisha Bhargava L. Tarri Glenn, MD, MPH 06/16/2019, 1:52 PM

## 2019-06-17 MED FILL — MONTELUKAST SOD 10 MG TAB: 10 | 90 days supply | Qty: 90 | Fill #0

## 2019-06-17 MED FILL — VIT D2 1.25 MG (50,000 UNIT: 1.25 MG | 56 days supply | Qty: 8 | Fill #0

## 2019-06-17 MED FILL — SUPREP BOWEL PREP KIT: 17.5-3.13-1 | 1 days supply | Qty: 354 | Fill #0

## 2019-06-20 ENCOUNTER — Encounter: Payer: Self-pay | Admitting: Family Medicine

## 2019-06-28 ENCOUNTER — Encounter: Payer: Self-pay | Admitting: Family Medicine

## 2019-06-28 ENCOUNTER — Other Ambulatory Visit: Payer: Self-pay | Admitting: Family Medicine

## 2019-06-28 DIAGNOSIS — Z803 Family history of malignant neoplasm of breast: Secondary | ICD-10-CM

## 2019-06-30 ENCOUNTER — Telehealth: Payer: Self-pay | Admitting: Genetic Counselor

## 2019-06-30 NOTE — Telephone Encounter (Signed)
A genetic counseling appt has been scheduled for Stephanie Fields to see Clint Guy on 0000000 at 3pm. She's been made aware to arrive 15 minutes early.

## 2019-07-07 ENCOUNTER — Telehealth: Payer: Self-pay | Admitting: Genetic Counselor

## 2019-07-07 NOTE — Telephone Encounter (Signed)
Called patient regarding upcoming Webex appointment, per patient's request this is a walk-in visit.

## 2019-07-08 ENCOUNTER — Other Ambulatory Visit: Payer: Self-pay | Admitting: Genetic Counselor

## 2019-07-08 ENCOUNTER — Inpatient Hospital Stay: Payer: 59 | Attending: Genetic Counselor | Admitting: Genetic Counselor

## 2019-07-08 ENCOUNTER — Other Ambulatory Visit: Payer: Self-pay

## 2019-07-08 ENCOUNTER — Inpatient Hospital Stay: Payer: 59

## 2019-07-08 DIAGNOSIS — Z8371 Family history of colonic polyps: Secondary | ICD-10-CM

## 2019-07-08 DIAGNOSIS — Z8042 Family history of malignant neoplasm of prostate: Secondary | ICD-10-CM | POA: Diagnosis not present

## 2019-07-08 DIAGNOSIS — Z803 Family history of malignant neoplasm of breast: Secondary | ICD-10-CM

## 2019-07-08 DIAGNOSIS — Z808 Family history of malignant neoplasm of other organs or systems: Secondary | ICD-10-CM

## 2019-07-08 DIAGNOSIS — Z800 Family history of malignant neoplasm of digestive organs: Secondary | ICD-10-CM

## 2019-07-08 DIAGNOSIS — Z1379 Encounter for other screening for genetic and chromosomal anomalies: Secondary | ICD-10-CM

## 2019-07-09 ENCOUNTER — Encounter: Payer: Self-pay | Admitting: Genetic Counselor

## 2019-07-09 DIAGNOSIS — Z808 Family history of malignant neoplasm of other organs or systems: Secondary | ICD-10-CM | POA: Insufficient documentation

## 2019-07-09 DIAGNOSIS — Z8042 Family history of malignant neoplasm of prostate: Secondary | ICD-10-CM | POA: Insufficient documentation

## 2019-07-09 DIAGNOSIS — Z800 Family history of malignant neoplasm of digestive organs: Secondary | ICD-10-CM | POA: Insufficient documentation

## 2019-07-09 NOTE — Progress Notes (Signed)
REFERRING PROVIDER: Orma Flaming, MD [address] [place],  [place] [address]  PRIMARY PROVIDER:  Orma Flaming, MD  PRIMARY REASON FOR VISIT:  1. Family history of prostate cancer   2. Family history of polyps in the colon   3. Family history of pancreatic cancer   4. Family history of breast cancer   5. Family history of melanoma   6. Family history of basal cell carcinoma     HISTORY OF PRESENT ILLNESS:   [name], a 38 y.o. female, was seen for a [place] cancer genetics consultation at the request of Dr. Rogers Blocker due to a family history of breast, prostate, pancreatic cancer and colon polyps.  Ms. Fields presents to clinic today to discuss the possibility of a hereditary predisposition to cancer, genetic testing, and to further clarify her future cancer risks, as well as potential cancer risks for family members.    [name] is a 38 y.o. female with no personal history of cancer.    CANCER HISTORY:  Oncology History   No history exists.     RISK FACTORS:  Menarche was at age 38.  First live birth at age 30.  OCP use for approximately 8-10 years.  Ovaries intact: yes.  Hysterectomy: no.  Menopausal status: premenopausal.  HRT use: 0 years. Colonoscopy: no, scheduled next week. Mammogram within the last year: no. Number of breast biopsies: 0. Any excessive radiation exposure in the past: no  Past Medical History:  Diagnosis Date   Borderline hyperlipidemia    Depression    Family history of basal cell carcinoma    Family history of melanoma    Family history of pancreatic cancer    Family history of prostate cancer    Kidney stones    Obesity     Past Surgical History:  Procedure Laterality Date   TOOTH EXTRACTION      Social History   Socioeconomic History   Marital status: Married    Spouse name: Not on file   Number of children: Not on file   Years of education: Not on file   Highest education level: Not on  file  Occupational History   Not on file  Social Needs   Financial resource strain: Not on file   Food insecurity    Worry: Not on file    Inability: Not on file   Transportation needs    Medical: Not on file    Non-medical: Not on file  Tobacco Use   Smoking status: Never Smoker   Smokeless tobacco: Never Used  Substance and Sexual Activity   Alcohol use: Yes    Comment: rarely   Drug use: Never   Sexual activity: Yes  Lifestyle   Physical activity    Days per week: Not on file    Minutes per session: Not on file   Stress: Not on file  Relationships   Social connections    Talks on phone: Not on file    Gets together: Not on file    Attends religious service: Not on file    Active member of club or organization: Not on file    Attends meetings of clubs or organizations: Not on file    Relationship status: Not on file  Other Topics Concern   Not on file  Social History Narrative   Not on file     FAMILY HISTORY:  We obtained a detailed, 4-generation family history.  Significant diagnoses are listed below: Family History  Problem  Relation Age of Onset   Early death Mother    Alcohol abuse Mother    COPD Mother    Depression Mother    Diabetes Mother    Hyperlipidemia Mother    Hypertension Mother    Mental illness Mother    Hypothyroidism Mother    Depression Father    Diabetes Father    Hearing loss Father    Hyperlipidemia Father    Hypertension Father    Colon polyps Father 39   Prostate cancer Father 65       prostate cancer   Depression Sister    Depression Brother    Learning disabilities Daughter        Autism   Breast cancer Maternal Aunt 60       negative genetic test   Heart disease Maternal Grandmother    Breast cancer Maternal Grandmother 74       thyroid ca, breast ca,squamous ca, basal cell ca, melanoma   Basal cell carcinoma Maternal Grandmother    Melanoma Maternal Grandmother 92   Alcohol  abuse Maternal Grandfather    Diabetes Maternal Grandfather    Heart attack Maternal Grandfather    Heart disease Maternal Grandfather    Hyperlipidemia Maternal Grandfather    Hypertension Maternal Grandfather    Arthritis Paternal Grandmother    Hearing loss Paternal Grandmother    Heart attack Paternal Grandmother    Heart disease Paternal Grandmother    Hyperlipidemia Paternal Grandmother    Heart disease Paternal Grandfather    Hyperlipidemia Paternal Grandfather    Alzheimer's disease Paternal Grandfather    Depression Sister    Depression Brother    Breast cancer Maternal Aunt 61   Pancreatic cancer Paternal Uncle 14   Breast cancer Maternal Aunt 47       negative genetic test   Breast cancer Maternal Great-grandmother 11   [name] has two daughters ages 81 and 54, and two sons ages 37 and 90. She has two brothers and two sisters, all in their 55s without a diagnosis of cancer.  [name] mother died at the age of 56, without a diagnosis of cancer but with a history of abnormal mammograms and biopsies.  She has six maternal aunts and two maternal uncles. Three of her aunts have had breast cancer - one at age 51 with negative genetic testing on an [employer] 36 gene panel, one at age 109 without genetic testing, and one at age 15 with negative genetic testing on an Invitae 64 gene panel. Her maternal grandmother had breast cancer at age 67 and a recurrence later on, basal cell carcinoma, melanoma, and possibly thyroid cancer (or a thyroid nodule). Her grandmother's mother also had breast cancer at age 68.   [name]'s father had stage IIIB prostate cancer diagnosed at age 24, and a history of multiple (more than 34) colon polyps beginning at age 61. Her father has a history of having 3-5 polyps removed every couple of years, and had a hemicolectomy when he was 74 due to the polyps. He has not had genetic testing. [name] has five paternal  aunts and two paternal uncles. One of her uncles died from pancreatic cancer at age 70. Her paternal grandparents died in their early 62s without a diagnosis of cancer.  [name] is aware of previous family history of genetic testing for hereditary cancer risks in two of her maternal aunts and one of her maternal cousins, which were all negative. Her maternal ancestors are  of [place] and [place] descent, and paternal ancestors are of [place] and [place] descent. There is no reported Ashkenazi Jewish ancestry. There is no known consanguinity.  GENETIC COUNSELING ASSESSMENT: [name] is a 38 y.o. female with a family history of breast, prostate, pancreatic cancer and multiple colon polyps, which is somewhat suggestive of a hereditary cancer syndrome and predisposition to cancer. We, therefore, discussed and recommended the following at today's visit.   DISCUSSION:  We discussed that 5-10% of breast cancer is hereditary, with most cases associated with BRCA1/2.  There are other genes that can be associated with hereditary breast cancer syndromes.  These include ATM, CHEK2, PALB2, etc.  While [name] has a significant maternal family history of breast cancer, two of her affected relatives have had negative genetic testing, decreasing the likelihood for a known hereditary cancer syndrome on the maternal side of the family.  Approximately 15-20% of breast cancer familial, wherein there is more breast cancer than expected in a family but a single hereditary cause is not identified.  We discussed that there may be changes in other genes that increase the risk for cancer that we don't know about, or that we don't have the technology to detect.  It is also possible that it is not a single hereditary cause, but rather a combination of genetic and environmental factors that increases the risk for breast cancer in the family.  We also discussed that polyps in general are common, however, most  people have fewer than 5 lifetime polyps.  When an individual has 10 or more polyps we become concerned about an underlying polyposis syndrome.  The most common hereditary polyposis syndromes are caused by problems in the APC and MUTYH genes, however, more recently, mutations in the Heber and MSH3 genes have been identified in some polyposis families.  We discussed that testing and identifying a hereditary cancer syndrome is beneficial for several reasons, including knowing about other cancer risks, identifying potential screening and risk-reduction options that may be appropriate, and to understand if other family members could be at risk for cancer and allow them to undergo genetic testing.  We reviewed the characteristics, features and inheritance patterns of hereditary cancer syndromes. We also discussed genetic testing, including the appropriate family members to test, the process of testing, insurance coverage and turn-around-time for results. We discussed the implications of a negative, positive and/or variant of uncertain significant result. We recommended [name] pursue genetic testing for the [facility] CustomNext-Cancer+RNAinsight panel.   The CustomNext-Cancer+RNAinsight panel offered by [facility] includes sequencing and rearrangement analysis for up to 91 genes, which will include the following 47 genes for [name]:  APC*, ATM*, AXIN2, BARD1, BMPR1A, BRCA1*, BRCA2*, BRIP1*, CDH1*, CDK4, CDKN2A, CHEK2*, DICER1, EPCAM, GREM1, HOXB13, MEN1, MLH1*, MSH2*, MSH3, MSH6*, MUTYH*, NBN, NF1*, NF2, NTHL1, PALB2*, PMS2*, POLD1, POLE, PTEN*, RAD51C*, RAD51D*, RECQL, RET, SDHA, SDHAF2, SDHB, SDHC, SDHD, SMAD4, SMARCA4, STK11, TP53*, TSC1, TSC2, and VHL.  DNA and RNA analyses performed for * genes.   Based on [name]'s family history of cancer, she meets medical criteria for genetic testing. Despite that she meets criteria, she may still have an out of pocket cost. We discussed that if  her out of pocket cost for testing is over $100, the laboratory will call and confirm whether she wants to proceed with testing.  If the out of pocket cost of testing is less than $100 she will be billed by the genetic testing laboratory.   Based on the patient's personal  and family history, statistical models (Tyrer-Cuzick and the [place]) were used to estimate her risk of developing breast cancer. This estimates her lifetime risk of developing breast cancer to be approximately 21%. This estimation does not consider any genetic testing results.  The patient's lifetime breast cancer risk is a preliminary estimate based on available information using one of several models endorsed by the [place] (ACS). The ACS recommends consideration of breast MRI screening as an adjunct to mammography for patients at high risk (defined as 20% or greater lifetime risk). The Baker Janus model estimates her 5-year risk for breast cancer to be approximately 0.6%. Consideration for chemoprevention is recommended for women who have a 5-year breast cancer risk of 1.7% or greater.  [name] has been determined to be at high risk for breast cancer.  Therefore, we recommend that annual screening with mammography and breast MRI be performed, beginning 10 years prior to the age of breast cancer diagnosis in a relative.  We discussed that [name] should discuss her individual situation with her referring physician and determine a breast cancer screening plan with which they are both comfortable.  We will also refer [name] to the [facility].  Tyrer-Cuzick:   Baker Janus Model:    PLAN: After considering the risks, benefits, and limitations, [name] provided informed consent to pursue genetic testing and the blood sample was sent to [facility] for analysis of the CustomNext-Cancer+RNAinsight panel. Results should be available within approximately two-three weeks' time, at  which point they will be disclosed by telephone to [name], as will any additional recommendations warranted by these results. [name] will receive a summary of her genetic counseling visit and a copy of her results once available. This information will also be available in Epic.   Based on [name]'s family history, we recommended her father, who was diagnosed with prostate cancer at age 71 and has had multiple colon polyps, have genetic counseling and testing. [name] will let us know if we can be of any assistance in coordinating genetic counseling and/or testing for this family member.   [name] questions were answered to her satisfaction today. Our contact information was provided should additional questions or concerns arise. Thank you for the referral and allowing [place] to share in the care of your patient.   Clint Guy, MS, [facility] Certified Genetic Counselor [email or web address] Phone: [phone]  The patient was seen for a total of 40 minutes in face-to-face genetic counseling.  This patient was discussed with Drs. Magrinat, Lindi Adie and/or Burr Medico who agrees with the above.    _______________________________________________________________________ For Office Staff:  Number of people involved in session: 1 Was an Intern/ student involved with case: no

## 2019-07-12 ENCOUNTER — Encounter: Payer: Self-pay | Admitting: Gastroenterology

## 2019-07-13 ENCOUNTER — Other Ambulatory Visit: Payer: Self-pay | Admitting: Gastroenterology

## 2019-07-13 ENCOUNTER — Ambulatory Visit (INDEPENDENT_AMBULATORY_CARE_PROVIDER_SITE_OTHER): Payer: 59

## 2019-07-13 DIAGNOSIS — Z03818 Encounter for observation for suspected exposure to other biological agents ruled out: Secondary | ICD-10-CM | POA: Diagnosis not present

## 2019-07-13 DIAGNOSIS — Z1159 Encounter for screening for other viral diseases: Secondary | ICD-10-CM

## 2019-07-13 LAB — SARS CORONAVIRUS 2 (TAT 6-24 HRS): SARS Coronavirus 2: NEGATIVE

## 2019-07-15 ENCOUNTER — Other Ambulatory Visit: Payer: Self-pay

## 2019-07-15 ENCOUNTER — Ambulatory Visit (AMBULATORY_SURGERY_CENTER): Payer: 59 | Admitting: Gastroenterology

## 2019-07-15 ENCOUNTER — Encounter: Payer: Self-pay | Admitting: Gastroenterology

## 2019-07-15 VITALS — BP 140/92 | HR 62 | Temp 98.7°F | Resp 19 | Ht 67.0 in | Wt 228.0 lb

## 2019-07-15 DIAGNOSIS — D123 Benign neoplasm of transverse colon: Secondary | ICD-10-CM

## 2019-07-15 DIAGNOSIS — D122 Benign neoplasm of ascending colon: Secondary | ICD-10-CM | POA: Diagnosis not present

## 2019-07-15 DIAGNOSIS — Z8371 Family history of colonic polyps: Secondary | ICD-10-CM | POA: Diagnosis not present

## 2019-07-15 DIAGNOSIS — Z1211 Encounter for screening for malignant neoplasm of colon: Secondary | ICD-10-CM

## 2019-07-15 MED ORDER — SODIUM CHLORIDE 0.9 % IV SOLN: 500.0000 mL | Freq: Once | INTRAVENOUS | Status: DC

## 2019-07-15 NOTE — Patient Instructions (Signed)
Handout on polyps given   YOU HAD AN ENDOSCOPIC PROCEDURE TODAY AT THE [facility]:   Refer to the procedure report that was given to you for any specific questions about what was found during the examination.  If the procedure report does not answer your questions, please call your gastroenterologist to clarify.  If you requested that your care partner not be given the details of your procedure findings, then the procedure report has been included in a sealed envelope for you to review at your convenience later.  YOU SHOULD EXPECT: Some feelings of bloating in the abdomen. Passage of more gas than usual.  Walking can help get rid of the air that was put into your GI tract during the procedure and reduce the bloating. If you had a lower endoscopy (such as a colonoscopy or flexible sigmoidoscopy) you may notice spotting of blood in your stool or on the toilet paper. If you underwent a bowel prep for your procedure, you may not have a normal bowel movement for a few days.  Please Note:  You might notice some irritation and congestion in your nose or some drainage.  This is from the oxygen used during your procedure.  There is no need for concern and it should clear up in a day or so.  SYMPTOMS TO REPORT IMMEDIATELY:   Following lower endoscopy (colonoscopy or flexible sigmoidoscopy):  Excessive amounts of blood in the stool  Significant tenderness or worsening of abdominal pains  Swelling of the abdomen that is new, acute  Fever of 100F or higher   For urgent or emergent issues, a gastroenterologist can be reached at any hour by calling [phone].   DIET:  We do recommend a small meal at first, but then you may proceed to your regular diet.  Drink plenty of fluids but you should avoid alcoholic beverages for 24 hours.  ACTIVITY:  You should plan to take it easy for the rest of today and you should NOT DRIVE or use heavy machinery until tomorrow (because of the sedation  medicines used during the test).    FOLLOW UP: Our staff will call the number listed on your records 48-72 hours following your procedure to check on you and address any questions or concerns that you may have regarding the information given to you following your procedure. If we do not reach you, we will leave a message.  We will attempt to reach you two times.  During this call, we will ask if you have developed any symptoms of COVID 19. If you develop any symptoms (ie: fever, flu-like symptoms, shortness of breath, cough etc.) before then, please call [phone].  If you test positive for Covid 19 in the 2 weeks post procedure, please call and report this information to us.    If any biopsies were taken you will be contacted by phone or by letter within the next 1-3 weeks.  Please call us at [phone] if you have not heard about the biopsies in 3 weeks.    SIGNATURES/CONFIDENTIALITY: You and/or your care partner have signed paperwork which will be entered into your electronic medical record.  These signatures attest to the fact that that the information above on your After Visit Summary has been reviewed and is understood.  Full responsibility of the confidentiality of this discharge information lies with you and/or your care-partner. 

## 2019-07-15 NOTE — Progress Notes (Signed)
Called to room to assist during endoscopic procedure.  Patient ID and intended procedure confirmed with present staff. Received instructions for my participation in the procedure from the performing physician.  

## 2019-07-15 NOTE — Op Note (Addendum)
[facility] Patient Name: Stephanie Fields Procedure Date: 07/15/2019 1:49 PM MRN: [id number] Endoscopist: Thornton Park MD, MD Age: 38 Referring MD:  Date of Birth: 16-Jun-1981 Gender: Female Account #: [id number] Procedure:                Colonoscopy Indications:              Screening for colon cancer: Family history of colon                            polyps in distant relative(s) before age 59                           Family history of colon polyps (father with tubular                            adenomas in his 46s)                           Hemorrhoids with occasional rectal bleeding                           No prior colon cancer screening Medicines:                Monitored Anesthesia Care Procedure:                Pre-Anesthesia Assessment:                           - Prior to the procedure, a History and Physical                            was performed, and patient medications and                            allergies were reviewed. The patient's tolerance of                            previous anesthesia was also reviewed. The risks                            and benefits of the procedure and the sedation                            options and risks were discussed with the patient.                            All questions were answered, and informed consent                            was obtained. Prior Anticoagulants: The patient has                            taken no previous anticoagulant or antiplatelet  agents. ASA Grade Assessment: I - A normal, healthy                            patient. After reviewing the risks and benefits,                            the patient was deemed in satisfactory condition to                            undergo the procedure.                           After obtaining informed consent, the colonoscope                            was passed under direct vision. Throughout the                             procedure, the patient's blood pressure, pulse, and                            oxygen saturations were monitored continuously. The                            Colonoscope was introduced through the anus and                            advanced to the the terminal ileum, with                            identification of the appendiceal orifice and IC                            valve. A second forward view of the right colon was                            performed. The colonoscopy was performed without                            difficulty. The patient tolerated the procedure                            well. The quality of the bowel preparation was                            excellent. The terminal ileum, ileocecal valve,                            appendiceal orifice, and rectum were photographed. Scope In: 1:58:46 PM Scope Out: 2:14:09 PM Scope Withdrawal Time: 0 hours 11 minutes 6 seconds  Total Procedure Duration: 0 hours 15 minutes 23 seconds  Findings:                 The perianal and digital rectal examinations were  normal.                           A 3 mm polyp was found in the ascending colon. The                            polyp was flat. The polyp was removed with a cold                            snare. Resection and retrieval were complete.                            Estimated blood loss was minimal.                           A 1 mm possible polyp was found in the proximal                            transverse colon. The polyp was flat. The polyp was                            removed with a cold biopsy forceps. Resection and                            retrieval were complete. Estimated blood loss was                            minimal.                           Non-bleeding internal hemorrhoids were found.                           The exam was otherwise without abnormality on                            direct and retroflexion  views. Complications:            No immediate complications. Estimated blood loss:                            Minimal. Estimated Blood Loss:     Estimated blood loss was minimal. Impression:               - One 3 mm polyp in the ascending colon, removed                            with a cold snare. Resected and retrieved.                           - One 1 mm polyp in the proximal transverse colon,                            removed with a cold biopsy forceps. Resected and  retrieved.                           - Non-bleeding internal hemorrhoids.                           - The examination was otherwise normal on direct                            and retroflexion views. Recommendation:           - Patient has a contact number available for                            emergencies. The signs and symptoms of potential                            delayed complications were discussed with the                            patient. Return to normal activities tomorrow.                            Written discharge instructions were provided to the                            patient.                           - Resume previous diet.                           - Continue present medications.                           - Await pathology results.                           - Repeat colonoscopy in 5 years for surveillance                            given the family history. Thornton Park MD, MD 07/15/2019 2:20:44 PM This report has been signed electronically.

## 2019-07-15 NOTE — Progress Notes (Signed)
Report given to PACU, vss 

## 2019-07-19 ENCOUNTER — Telehealth: Payer: Self-pay | Admitting: *Deleted

## 2019-07-19 ENCOUNTER — Telehealth: Payer: Self-pay

## 2019-07-19 NOTE — Telephone Encounter (Signed)
NO ANSWER, MESSAGE LEFT FOR PATIENT. 

## 2019-07-19 NOTE — Telephone Encounter (Signed)
No answer for second post procedure call back. Unable to leave message. 

## 2019-07-21 ENCOUNTER — Encounter: Payer: Self-pay | Admitting: Genetic Counselor

## 2019-07-21 ENCOUNTER — Telehealth: Payer: Self-pay | Admitting: Genetic Counselor

## 2019-07-21 ENCOUNTER — Ambulatory Visit: Payer: Self-pay | Admitting: Genetic Counselor

## 2019-07-21 DIAGNOSIS — Z1379 Encounter for other screening for genetic and chromosomal anomalies: Secondary | ICD-10-CM

## 2019-07-21 NOTE — Telephone Encounter (Signed)
Revealed negative genetic testing.  Discussed that we do not know why there is cancer in the family.  It could be due to a different gene that we are not testing, or our current technology may not be able detect something.  It will be important for her to keep in contact with genetics to keep up with whether additional testing may be appropriate in the future.

## 2019-07-21 NOTE — Progress Notes (Signed)
HPI:  Stephanie Fields was previously seen in the [facility] clinic due to a family history of breast, pancreatic, and prostate cancer, as well as colon polyps, and concerns regarding a hereditary predisposition to cancer. Please refer to our prior cancer genetics clinic note for more information regarding our discussion, assessment and recommendations, at the time. Stephanie Fields recent genetic test results were disclosed to her, as were recommendations warranted by these results. These results and recommendations are discussed in more detail below.  CANCER HISTORY:  Oncology History   No history exists.    FAMILY HISTORY:  We obtained a detailed, 4-generation family history.  Significant diagnoses are listed below: Family History  Problem Relation Age of Onset   Early death Mother    Alcohol abuse Mother    COPD Mother    Depression Mother    Diabetes Mother    Hyperlipidemia Mother    Hypertension Mother    Mental illness Mother    Hypothyroidism Mother    Depression Father    Diabetes Father    Hearing loss Father    Hyperlipidemia Father    Hypertension Father    Colon polyps Father 71   Prostate cancer Father 39       prostate cancer   Depression Sister    Depression Brother    Learning disabilities Daughter        Autism   Breast cancer Maternal Aunt 60       negative genetic test   Heart disease Maternal Grandmother    Breast cancer Maternal Grandmother 74       thyroid ca, breast ca,squamous ca, basal cell ca, melanoma   Basal cell carcinoma Maternal Grandmother    Melanoma Maternal Grandmother 92   Alcohol abuse Maternal Grandfather    Diabetes Maternal Grandfather    Heart attack Maternal Grandfather    Heart disease Maternal Grandfather    Hyperlipidemia Maternal Grandfather    Hypertension Maternal Grandfather    Arthritis Paternal Grandmother    Hearing loss Paternal Grandmother    Heart attack Paternal  Grandmother    Heart disease Paternal Grandmother    Hyperlipidemia Paternal Grandmother    Heart disease Paternal Grandfather    Hyperlipidemia Paternal Grandfather    Alzheimer's disease Paternal Grandfather    Depression Sister    Depression Brother    Breast cancer Maternal Aunt 61   Pancreatic cancer Paternal Uncle 18   Breast cancer Maternal Aunt 47       negative genetic test   Breast cancer Maternal Great-grandmother 53   Stephanie Fields has two daughters ages 50 and 37, and two sons ages 92 and 31. She has two brothers and two sisters, all in their 58s without a diagnosis of cancer.  Stephanie Fields mother died at the age of 58, without a diagnosis of cancer but with a history of abnormal mammograms and biopsies.  She has six maternal aunts and two maternal uncles. Three of her aunts have had breast cancer - one at age 73 with negative genetic testing on an [employer] 36 gene panel, one at age 20 without genetic testing, and one at age 49 with negative genetic testing on an [employer] 16 gene panel. Her maternal grandmother had breast cancer at age 44 and a recurrence later on, basal cell carcinoma, melanoma, and possibly thyroid cancer (or a thyroid nodule). Her grandmother's mother also had breast cancer at age 73.   Stephanie Fields's father had stage IIIB prostate cancer diagnosed  at age 6, and a history of multiple (more than 10) colon polyps beginning at age 63. Her father has a history of having 3-5 polyps removed every couple of years, and had a hemicolectomy when he was 51 due to the polyps. He has not had genetic testing. Stephanie Fields has five paternal aunts and two paternal uncles. One of her uncles died from pancreatic cancer at age 21. Her paternal grandparents died in their early 33s without a diagnosis of cancer.  Stephanie Fields is aware of previous family history of genetic testing for hereditary cancer risks in two of her maternal aunts and one of her maternal  cousins, which were all negative. Her maternal ancestors are of [place] and [place] descent, and paternal ancestors are of [place] and [place] descent. There is no reported Ashkenazi Jewish ancestry. There is no known consanguinity.  GENETIC TEST RESULTS: Genetic testing reported out on 07/19/2019 through the [facility] [facility]-Cancer+RNAinsight panel which detected no pathogenic variants.   The CustomNext-Cancer+RNAinsight panel offered by [facility] includes sequencing and rearrangement analysis for up to 91 genes, which will include the following 47 genes for Stephanie Fields:  APC*, ATM*, AXIN2, BARD1, BMPR1A, BRCA1*, BRCA2*, BRIP1*, CDH1*, CDK4, CDKN2A, CHEK2*, DICER1, EPCAM, GREM1, HOXB13, MEN1, MLH1*, MSH2*, MSH3, MSH6*, MUTYH*, NBN, NF1*, NF2, NTHL1, PALB2*, PMS2*, POLD1, POLE, PTEN*, RAD51C*, RAD51D*, RECQL, RET, SDHA, SDHAF2, SDHB, SDHC, SDHD, SMAD4, SMARCA4, STK11, TP53*, TSC1, TSC2, and VHL.  DNA and RNA analyses performed for * genes. The test report will be scanned into EPIC and located under the Molecular Pathology section of the Results Review tab.  A portion of the result report is included below for reference.     We discussed with Stephanie Fields that because current genetic testing is not perfect, it is possible there may be a gene mutation in one of these genes that current testing cannot detect, but that chance is small.  We also discussed, that there could be another gene that has not yet been discovered, or that we have not yet tested, that is responsible for the cancer diagnoses in the family. It is also possible there is a hereditary cause for the cancer in the family that Stephanie Fields did not inherit and therefore was not identified in her testing.  Therefore, it is important to remain in touch with cancer genetics in the future so that we can continue to offer Stephanie Fields the most up to date genetic testing.   CANCER SCREENING RECOMMENDATIONS: Stephanie Fields test  result is considered negative (normal).  This means that we have not identified a hereditary cause for her family history of cancer at this time.  Given Stephanie Fields family histories, we must interpret these negative results with some caution.  Families with features suggestive of hereditary risk for cancer tend to have multiple family members with cancer, diagnoses in multiple generations and diagnoses before the age of 6. Stephanie Fields family exhibits some of these features. Thus, this result may simply reflect our current inability to detect all mutations within these genes or there may be a different gene that has not yet been discovered or tested.   An individual's cancer risk and medical management are not determined by genetic test results alone. Overall cancer risk assessment incorporates additional factors, including personal medical history, family history, and any available genetic information that may result in a personalized plan for cancer prevention and surveillance.  As discussed in greater detail in our previous cancer genetics clinic note, Stephanie Fields has been  determined to be at high risk for breast cancer.  We, therefore, discussed that it is reasonable for Stephanie Fields to be followed by a high-risk breast cancer clinic; in addition to a yearly mammogram and physical exam by a healthcare provider, she should discuss the usefulness of an annual breast MRI with the high-risk clinic providers. Stephanie Fields is interested in being seen in the high-risk breast clinic and this referral has been made.  RECOMMENDATIONS FOR FAMILY MEMBERS:  Individuals in this family might be at some increased risk of developing cancer, over the general population risk, simply due to the family history of cancer.  We recommended women in this family have a yearly mammogram beginning at age 86, or 71 years younger than the earliest onset of cancer, an annual clinical breast exam, and perform  monthly breast self-exams. Women in this family should also have a gynecological exam as recommended by their primary provider. All family members should have a colonoscopy by age 16.  It is also possible there is a hereditary cause for the cancer in Ms. Danielsen's family that she did not inherit and therefore was not identified in her.  Based on Ms. Montanaro's family history, we recommended her father, who was diagnosed with multiple colon polyps resulting in a hemicolectomy at age 65, have genetic counseling and testing. Ms. Jentz will let us know if we can be of any assistance in coordinating genetic counseling and/or testing for this family member.   FOLLOW-UP: Lastly, we discussed with Ms. Meisenheimer that cancer genetics is a rapidly advancing field and it is possible that new genetic tests will be appropriate for her and/or her family members in the future. We encouraged her to remain in contact with cancer genetics on an annual basis so we can update her personal and family histories and let her know of advances in cancer genetics that may benefit this family.   Our contact number was provided. Ms. Pociask questions were answered to her satisfaction, and she knows she is welcome to call us at anytime with additional questions or concerns.   Clint Guy, MS, [facility] Certified Genetic Counselor [email or web address] Phone: [phone]

## 2019-07-23 ENCOUNTER — Telehealth: Payer: Self-pay | Admitting: Genetic Counselor

## 2019-07-23 NOTE — Telephone Encounter (Signed)
I cld and left Ms. Bischof a vm to schedule an appt for the high risk breast clinic.

## 2019-07-26 ENCOUNTER — Encounter: Payer: Self-pay | Admitting: Gastroenterology

## 2019-07-27 ENCOUNTER — Other Ambulatory Visit: Payer: Self-pay | Admitting: Family Medicine

## 2019-07-27 DIAGNOSIS — Z803 Family history of malignant neoplasm of breast: Secondary | ICD-10-CM

## 2019-07-30 ENCOUNTER — Telehealth: Payer: Self-pay | Admitting: Hematology and Oncology

## 2019-07-30 NOTE — Telephone Encounter (Signed)
MS. Forino returned my call to be scheduled for the high risk breast clinic. A new patient appt has been scheduled for her to see Dr. Lindi Adie on 12/28 at 345pm. She's been made aware to arrive 15 minutes early.

## 2019-08-22 NOTE — Progress Notes (Signed)
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 She underwent genetic testing in 06/2019 which was negative. She has a family history of breast cancer in 3 maternal aunts, her maternal grandmother, and her maternal great grandmother. She has a family history of prostate cancer in her father and pancreatic cancer in a paternal uncle. She presents to the clinic today for initial evaluation and discussion of surveillance options.   I reviewed her records extensively and collaborated the history with the patient.  MEDICAL HISTORY:  Past Medical History:  Diagnosis Date  . Borderline hyperlipidemia   . Depression   . Family history of basal cell carcinoma   . Family history of melanoma   . Family history of pancreatic cancer   . Family history of prostate cancer   . Kidney stones   . Obesity     SURGICAL HISTORY: Past Surgical History:  Procedure Laterality Date  . TOOTH EXTRACTION      SOCIAL HISTORY: Social History   Socioeconomic History  . Marital status: Married    Spouse name: Not on file  . Number of children: Not on file  . Years of education: Not on file  . Highest education level: Not on file  Occupational History  . Not on file  Tobacco Use  . Smoking status: Never Smoker  . Smokeless tobacco: Never Used  Substance and Sexual Activity  . Alcohol use: Yes    Comment: rarely  . Drug use: Never  . Sexual activity: Yes  Other Topics Concern  . Not on file  Social History Narrative  . Not on file   Social Determinants of Health   Financial Resource Strain:   . Difficulty of Paying Living Expenses: Not on file  Food Insecurity:   . Worried About Sales executive in the Last Year: Not on file  . Ran Out of Food in the Last Year: Not on file  Transportation Needs:   . Lack of Transportation (Medical): Not on file  . Lack of Transportation (Non-Medical): Not on file  Physical Activity:   . Days of Exercise per Week: Not on file  . Minutes of Exercise per Session: Not on file  Stress:   . Feeling of Stress : Not on file  Social Connections:   . Frequency of Communication with Friends and Family: Not on file  . Frequency of Social Gatherings with Friends and Family: Not on file  . Attends Religious Services: Not on file  . Active Member of Clubs or Organizations: Not on file  . Attends Archivist Meetings: Not on file  . Marital Status: Not on file  Intimate Partner Violence:   . Fear of Current or Ex-Partner: Not on file  . Emotionally Abused: Not on file  . Physically Abused: Not on file  . Sexually Abused: Not on file    FAMILY HISTORY: Family History  Problem Relation Age of Onset  . Early death Mother   . Alcohol abuse Mother   . COPD Mother   . Depression Mother   . Diabetes Mother   . Hyperlipidemia Mother   . Hypertension Mother   . Mental illness Mother   . Hypothyroidism Mother   .  Depression Father   . Diabetes Father   . Hearing loss Father   . Hyperlipidemia Father   . Hypertension Father   . Colon polyps Father 81  . Prostate cancer Father 1       prostate cancer  . Depression Sister   . Depression Brother   . Learning disabilities Daughter        Autism  . Breast cancer Maternal Aunt 60       negative genetic test  . Heart disease Maternal Grandmother   . Breast cancer Maternal Grandmother 74       thyroid ca, breast ca,squamous ca, basal cell ca, melanoma  . Basal cell carcinoma Maternal Grandmother   . Melanoma Maternal Grandmother 18  . Alcohol abuse Maternal Grandfather   . Diabetes Maternal Grandfather   . Heart attack Maternal Grandfather   . Heart disease Maternal Grandfather   .  Hyperlipidemia Maternal Grandfather   . Hypertension Maternal Grandfather   . Arthritis Paternal Grandmother   . Hearing loss Paternal Grandmother   . Heart attack Paternal Grandmother   . Heart disease Paternal Grandmother   . Hyperlipidemia Paternal Grandmother   . Heart disease Paternal Grandfather   . Hyperlipidemia Paternal Grandfather   . Alzheimer's disease Paternal Grandfather   . Depression Sister   . Depression Brother   . Breast cancer Maternal Aunt 61  . Pancreatic cancer Paternal Uncle 5  . Breast cancer Maternal Aunt 47       negative genetic test  . Breast cancer Maternal Great-grandmother 43    ALLERGIES:  is allergic to bupropion.  MEDICATIONS:  Current Outpatient Medications  Medication Sig Dispense Refill  . levonorgestrel (MIRENA) 20 MCG/24HR IUD by Intrauterine route.    . montelukast (SINGULAIR) 10 MG tablet Take 1 tablet (10 mg total) by mouth at bedtime. 90 tablet 3  . Vitamin D, Ergocalciferol, (DRISDOL) 1.25 MG (50000 UT) CAPS capsule One capsule by mouth once a week for 8 weeks. Then take 1000IU/day 8 capsule 0   No current facility-administered medications for this visit.    REVIEW OF SYSTEMS:   Constitutional: Denies fevers, chills or abnormal night sweats Eyes: Denies blurriness of vision, double vision or watery eyes Ears, nose, mouth, throat, and face: Denies mucositis or sore throat Respiratory: Denies cough, dyspnea or wheezes Cardiovascular: Denies palpitation, chest discomfort or lower extremity swelling Gastrointestinal:  Denies nausea, heartburn or change in bowel habits Skin: Denies abnormal skin rashes Lymphatics: Denies new lymphadenopathy or easy bruising Neurological:Denies numbness, tingling or new weaknesses Behavioral/Psych: Mood is stable, no new changes  Breast: Denies any palpable lumps or discharge All other systems were reviewed with the patient and are negative.  PHYSICAL EXAMINATION: ECOG PERFORMANCE STATUS: 0 -  Asymptomatic  Vitals:   08/23/19 1543  BP: 135/64  Pulse: 73  Resp: 20  Temp: 97.9 F (36.6 C)  SpO2: 98%   Filed Weights   08/23/19 1543  Weight: 226 lb 4.8 oz (102.6 kg)    GENERAL:alert, no distress and comfortable SKIN: skin color, texture, turgor are normal, no rashes or significant lesions EYES: normal, conjunctiva are pink and non-injected, sclera clear OROPHARYNX:no exudate, no erythema and lips, buccal mucosa, and tongue normal  NECK: supple, thyroid normal size, non-tender, without nodularity LYMPH:  no palpable lymphadenopathy in the cervical, axillary or inguinal LUNGS: clear to auscultation and percussion with normal breathing effort HEART: regular rate & rhythm and no murmurs and no lower extremity edema ABDOMEN:abdomen soft, non-tender and normal bowel  sounds Musculoskeletal:no cyanosis of digits and no clubbing  PSYCH: alert & oriented x 3 with fluent speech NEURO: no focal motor/sensory deficits  LABORATORY DATA:  I have reviewed the data as listed Lab Results  Component Value Date   WBC 8.5 05/14/2019   HGB 14.2 05/14/2019   HCT 43.1 05/14/2019   MCV 85.0 05/14/2019   PLT 258 05/14/2019   Lab Results  Component Value Date   NA 142 05/14/2019   K 3.7 05/14/2019   CL 105 05/14/2019   CO2 23 05/14/2019    RADIOGRAPHIC STUDIES: I have personally reviewed the radiological reports and agreed with the findings in the report.  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 Genetic testing 07/19/2019: No pathogenic variants or mutations identified Tyrer Cusick: Lifetime risk: 21% (population risk: 12.9%) 10-year risk: 2.7% (population risk: 1.6%) Baker Janus model: 5-year risk developing breast cancer 0.6%  We discussed the pros and cons of antiestrogen therapy with tamoxifen for risk reduction.  5 years of tamoxifen would reduce her breast cancer risk by half.  Tamoxifen counseling: We discussed  the risks and benefits of tamoxifen. These include but not limited to insomnia, hot flashes, mood changes, vaginal dryness, and weight gain. Although rare, serious side effects including endometrial cancer, risk of blood clots were also discussed. We strongly believe that the benefits far outweigh the risks. Patient understands these risks and consented to starting treatment. Planned treatment duration is 5 years.  Based on recently concluded results from TAM-01, I recommended starting her at 10 mg daily weighing the risks and benefits of breast cancer prevention and toxicities of tamoxifen.  Breast cancer surveillance: 1.  Based on the lifetime risk of 41%, I recommended annual breast MRIs, this will be done in June 2021. 2.  Annual mammograms as well: We will plan to do this at the breast center in the next week or 2.  Return to clinic in 1 year for follow-up   All questions were answered. The patient knows to call the clinic with any problems, questions or concerns.   Rulon Eisenmenger, MD, MPH 08/23/2019    I, Molly Dorshimer, am acting as scribe for Nicholas Lose, MD.  I have reviewed the above documentation for accuracy and completeness, and I agree with the above.

## 2019-08-23 ENCOUNTER — Inpatient Hospital Stay: Payer: 59 | Attending: Genetic Counselor | Admitting: Hematology and Oncology

## 2019-08-23 ENCOUNTER — Other Ambulatory Visit: Payer: Self-pay

## 2019-08-23 DIAGNOSIS — Z8042 Family history of malignant neoplasm of prostate: Secondary | ICD-10-CM

## 2019-08-23 DIAGNOSIS — Z800 Family history of malignant neoplasm of digestive organs: Secondary | ICD-10-CM

## 2019-08-23 DIAGNOSIS — Z803 Family history of malignant neoplasm of breast: Secondary | ICD-10-CM | POA: Diagnosis not present

## 2019-08-23 DIAGNOSIS — R922 Inconclusive mammogram: Secondary | ICD-10-CM | POA: Diagnosis not present

## 2019-08-23 DIAGNOSIS — Z9189 Other specified personal risk factors, not elsewhere classified: Secondary | ICD-10-CM

## 2019-08-23 DIAGNOSIS — Z79810 Long term (current) use of selective estrogen receptor modulators (SERMs): Secondary | ICD-10-CM | POA: Diagnosis not present

## 2019-08-23 MED ORDER — TAMOXIFEN CITRATE 10 MG PO TABS: 10.0000 mg | ORAL_TABLET | Freq: Every day | ORAL | 3 refills | Status: DC

## 2019-08-23 MED FILL — TAMOXIFEN 10 MG TABLET: 10 | 90 days supply | Qty: 90 | Fill #0

## 2019-08-23 NOTE — Assessment & Plan Note (Signed)
Genetic testing 07/19/2019: No pathogenic variants or mutations identified Tyrer Cusick: Lifetime risk: 21% (population risk: 12.9%) 10-year risk: 2.7% (population risk: 1.6%) Gail model: 5-year risk developing breast cancer 0.6%  We discussed the pros and cons of antiestrogen therapy with tamoxifen for risk reduction.  5 years of tamoxifen would reduce her breast cancer risk by half.  Tamoxifen counseling: We discussed the risks and benefits of tamoxifen. These include but not limited to insomnia, hot flashes, mood changes, vaginal dryness, and weight gain. Although rare, serious side effects including endometrial cancer, risk of blood clots were also discussed. We strongly believe that the benefits far outweigh the risks. Patient understands these risks and consented to starting treatment. Planned treatment duration is 5 years.  Breast cancer surveillance: 1.  Based on the lifetime risk of 41%, I recommended annual breast MRIs 2.  Annual mammograms as well  Return to clinic in 1 year for follow-up

## 2019-08-24 ENCOUNTER — Telehealth: Payer: Self-pay | Admitting: Hematology and Oncology

## 2019-08-24 NOTE — Telephone Encounter (Signed)
Scheduled per 12/28 los, left a voicemail.

## 2019-08-25 ENCOUNTER — Other Ambulatory Visit: Payer: Self-pay

## 2019-08-25 ENCOUNTER — Ambulatory Visit
Admission: RE | Admit: 2019-08-25 | Discharge: 2019-08-25 | Disposition: A | Discharge status: Home / Self Care | Payer: 59 | Source: Ambulatory Visit | Attending: Hematology and Oncology | Admitting: Hematology and Oncology

## 2019-08-25 DIAGNOSIS — Z1231 Encounter for screening mammogram for malignant neoplasm of breast: Secondary | ICD-10-CM | POA: Diagnosis not present

## 2019-08-25 DIAGNOSIS — Z9189 Other specified personal risk factors, not elsewhere classified: Secondary | ICD-10-CM

## 2019-09-20 ENCOUNTER — Other Ambulatory Visit: Payer: 59

## 2019-09-28 MED FILL — MONTELUKAST SOD 10 MG TAB: 10 | 90 days supply | Qty: 90 | Fill #1

## 2019-10-05 ENCOUNTER — Encounter: Payer: Self-pay | Admitting: Family Medicine

## 2019-10-05 NOTE — Telephone Encounter (Signed)
Is it okay to schedule Same Day slot?   Please Advise.

## 2019-10-06 NOTE — Telephone Encounter (Signed)
Done

## 2019-10-11 ENCOUNTER — Encounter: Payer: Self-pay | Admitting: Family Medicine

## 2019-10-11 ENCOUNTER — Telehealth: Payer: Self-pay

## 2019-10-11 ENCOUNTER — Other Ambulatory Visit: Payer: Self-pay

## 2019-10-11 ENCOUNTER — Telehealth: Payer: Self-pay | Admitting: Family Medicine

## 2019-10-11 ENCOUNTER — Ambulatory Visit (INDEPENDENT_AMBULATORY_CARE_PROVIDER_SITE_OTHER): Payer: 59 | Admitting: Family Medicine

## 2019-10-11 VITALS — Ht 67.0 in | Wt 226.0 lb

## 2019-10-11 DIAGNOSIS — F418 Other specified anxiety disorders: Secondary | ICD-10-CM | POA: Diagnosis not present

## 2019-10-11 MED ORDER — FLUOXETINE HCL 20 MG PO CAPS: 20.0000 mg | ORAL_CAPSULE | Freq: Every day | ORAL | 0 refills | Status: DC

## 2019-10-11 MED ORDER — FLUOXETINE HCL 20 MG PO TABS: 20.0000 mg | ORAL_TABLET | Freq: Every day | ORAL | 3 refills | Status: DC

## 2019-10-11 NOTE — Telephone Encounter (Signed)
FYI

## 2019-10-11 NOTE — Telephone Encounter (Signed)
Changed to tablets instead of capsules.  Stephanie Flaming, MD [place]

## 2019-10-11 NOTE — Telephone Encounter (Signed)
LVM for pharmacy to call the office back.

## 2019-10-11 NOTE — Addendum Note (Signed)
Addended by: Orma Flaming on: 10/11/2019 04:02 PM   Modules accepted: Orders

## 2019-10-11 NOTE — Telephone Encounter (Signed)
Pharmacy called back in and said that this medication may need to be changed because it interferes with her chemo medication but pharmacist wanted Dr.Wolfe to be advised.

## 2019-10-11 NOTE — Progress Notes (Signed)
Patient: Stephanie Fields MRN: [id number] DOB: [birth year] PCP: Orma Flaming, MD     I connected with Darel Hong on 10/11/19 at 10:32am by a video enabled telemedicine application and verified that I am speaking with the correct person using two identifiers.  Location patient: Home Location provider: [facility], Office Persons participating in this virtual visit: Dr. Buford Dresser and Dr. Orma Flaming   I discussed the limitations of evaluation and management by telemedicine and the availability of in person appointments. The patient expressed understanding and agreed to proceed.   Subjective:  Chief Complaint  Patient presents with  . Depression    HPI: The patient is a 39 y.o. female who presents today for possible depression or anxiety. She has past history of situational depression when her mother died three years ago and was put on zoloft. She stayed on this until 2019. She states she thinks her feelings of depression/anxiety have bene gradually building for awhile, but this past month have been really bad. She has been crying a lot and for no reason. She has stopped exercising, lost weight and has had decreased appetite. Her anxiety has been worsening as well and she was pretty sure she had a panic attack while on call at hospital last week. She has only had this happen once. She does have a family history of depression in her father. She denies any si/hi/ah/vh.   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 Unsure if this has been building. She works long hours as well with little breaks for self care.   Review of Systems  Constitutional: Negative for chills, fatigue and fever.  HENT: Negative for sinus pressure, sinus pain and sore throat.   Respiratory: Negative for cough, chest tightness and shortness of breath.   Cardiovascular: Negative for chest pain and palpitations.  Gastrointestinal: Negative for  abdominal pain, nausea and vomiting.  Genitourinary: Negative for flank pain, frequency and pelvic pain.  Musculoskeletal: Negative for back pain, joint swelling and neck pain.  Neurological: Negative for dizziness and headaches.  Psychiatric/Behavioral: The patient is nervous/anxious.     Allergies Patient is allergic to bupropion.  Past Medical History Patient  has a past medical history of Borderline hyperlipidemia, Depression, Family history of basal cell carcinoma, Family history of melanoma, Family history of pancreatic cancer, Family history of prostate cancer, Kidney stones, and Obesity.  Surgical History Patient  has a past surgical history that includes Tooth extraction.  Family History Pateint's family history includes Alcohol abuse in her maternal grandfather and mother; Alzheimer's disease in her paternal grandfather; Arthritis in her paternal grandmother; Basal cell carcinoma in her maternal grandmother; Breast cancer (age of onset: 99) in her maternal aunt; Breast cancer (age of onset: 64) in her maternal aunt; Breast cancer (age of onset: 12) in her maternal aunt; Breast cancer (age of onset: 30) in her maternal great-grandmother; Breast cancer (age of onset: 71) in her maternal grandmother; COPD in her mother; Colon polyps (age of onset: 98) in her father; Depression in her brother, brother, father, mother, sister, and sister; Diabetes in her father, maternal grandfather, and mother; Early death in her mother; Hearing loss in her father and paternal grandmother; Heart attack in her maternal grandfather and paternal grandmother; Heart disease in her maternal grandfather, maternal grandmother, paternal grandfather, and paternal grandmother; Hyperlipidemia in her father, maternal grandfather, mother, paternal grandfather, and paternal grandmother; Hypertension in her father, maternal grandfather, and mother; Hypothyroidism  in her mother; Learning disabilities in her daughter; Melanoma  (age of onset: 50) in her maternal grandmother; Mental illness in her mother; Pancreatic cancer (age of onset: 96) in her paternal uncle; Prostate cancer (age of onset: 16) in her father.  Social History Patient  reports that she has never smoked. She has never used smokeless tobacco. She reports current alcohol use. She reports that she does not use drugs.    Objective: Vitals:   10/11/19 1013  Weight: 226 lb (102.5 kg)  Height: 5\' 7"  (1.702 m)    Body mass index is 35.4 kg/m.  Physical Exam Vitals reviewed.  Constitutional:      Appearance: Normal appearance.  HENT:     Head: Normocephalic and atraumatic.  Pulmonary:     Effort: Pulmonary effort is normal.  Neurological:     General: No focal deficit present.     Mental Status: She is alert and oriented to person, place, and time.  Psychiatric:        Mood and Affect: Mood normal.        Behavior: Behavior normal.     Comments: No si/hi/ah/vh           Office Visit from 10/11/2019 in [facility]  PHQ-9 Total Score  18     GAD 7 : Generalized Anxiety Score 10/11/2019  Nervous, Anxious, on Edge 1  Control/stop worrying 2  Worry too much - different things 2  Trouble relaxing 3  Restless 0  Easily annoyed or irritable 3  Afraid - awful might happen 1  Total GAD 7 Score 12  Anxiety Difficulty Somewhat difficult     Assessment/plan: 1. Depression with anxiety phq9 and GAD7 scores both elevated moderately-moderately severe. She likely has a compounding source of both her anxiety and depression with a demanding job that requires long hours, pandemic, family and balancing all of this. She was trying to exercise and encouraged her to do this again if we can get her motivation back. counseling also discussed, but time is an issue for her. Due to daily impact of her depression and anxiety we are goin to start her on daily SSRI of prozac. Discussed we will start at low dose of 20mg  and if she feels  like it is helping and needs to titrate up after 3-4 weeks to 40mg  she can do that as well. I've explained to her that drugs of the SSRI class can have side effects such as weight gain, sexual dysfunction, insomnia, headache, nausea. These medications are generally effective at alleviating symptoms of anxiety and/or depression. Let me know if significant side effects do occur. Any si/hi she is to call 911 or go to ER. Will see her back in 4-6 week for f/u.     Return in about 1 month (around 11/08/2019) for depression/anxiety .  Records requested if needed. Time spent with patient: 25 minutes, of which >50% was spent in obtaining information about her symptoms, reviweing her previous labs, evaluations, and treatments, counseling her about her conditions (please see discussed topics above), and developing a plan to further investigate it; she had a number of questions which I addressed.    Orma Flaming, MD [place]  10/11/2019

## 2019-10-11 NOTE — Telephone Encounter (Signed)
[facility]  would like call back regarding FLUoxetine (PROZAC) 20 MG capsule

## 2019-10-13 MED FILL — FLUoxetine HCL 20 MG CAPS: 20 | 60 days supply | Qty: 90 | Fill #0

## 2019-10-13 NOTE — Telephone Encounter (Signed)
LVM with Pharmacy.

## 2019-10-13 NOTE — Telephone Encounter (Signed)
Please call pharmacy and have them fill. Aware of interaction.  Orma Flaming, MD [place]

## 2019-11-05 ENCOUNTER — Emergency Department (HOSPITAL_COMMUNITY): Payer: 59

## 2019-11-05 ENCOUNTER — Other Ambulatory Visit: Payer: Self-pay

## 2019-11-05 ENCOUNTER — Emergency Department (HOSPITAL_COMMUNITY)
Admission: EM | Admit: 2019-11-05 | Discharge: 2019-11-05 | Disposition: A | Discharge status: Home / Self Care | Payer: 59 | Attending: Emergency Medicine | Admitting: Emergency Medicine

## 2019-11-05 DIAGNOSIS — Z975 Presence of (intrauterine) contraceptive device: Secondary | ICD-10-CM | POA: Insufficient documentation

## 2019-11-05 DIAGNOSIS — Y929 Unspecified place or not applicable: Secondary | ICD-10-CM | POA: Insufficient documentation

## 2019-11-05 DIAGNOSIS — W19XXXA Unspecified fall, initial encounter: Secondary | ICD-10-CM

## 2019-11-05 DIAGNOSIS — S3993XA Unspecified injury of pelvis, initial encounter: Secondary | ICD-10-CM | POA: Diagnosis not present

## 2019-11-05 DIAGNOSIS — Y999 Unspecified external cause status: Secondary | ICD-10-CM | POA: Diagnosis not present

## 2019-11-05 DIAGNOSIS — R200 Anesthesia of skin: Secondary | ICD-10-CM | POA: Diagnosis not present

## 2019-11-05 DIAGNOSIS — M542 Cervicalgia: Secondary | ICD-10-CM | POA: Diagnosis not present

## 2019-11-05 DIAGNOSIS — Z79899 Other long term (current) drug therapy: Secondary | ICD-10-CM | POA: Insufficient documentation

## 2019-11-05 DIAGNOSIS — S069X9A Unspecified intracranial injury with loss of consciousness of unspecified duration, initial encounter: Secondary | ICD-10-CM | POA: Diagnosis not present

## 2019-11-05 DIAGNOSIS — S4492XA Injury of unspecified nerve at shoulder and upper arm level, left arm, initial encounter: Secondary | ICD-10-CM | POA: Diagnosis not present

## 2019-11-05 DIAGNOSIS — R079 Chest pain, unspecified: Secondary | ICD-10-CM | POA: Diagnosis not present

## 2019-11-05 DIAGNOSIS — R110 Nausea: Secondary | ICD-10-CM | POA: Diagnosis not present

## 2019-11-05 DIAGNOSIS — Y9352 Activity, horseback riding: Secondary | ICD-10-CM | POA: Diagnosis not present

## 2019-11-05 DIAGNOSIS — S0990XA Unspecified injury of head, initial encounter: Secondary | ICD-10-CM | POA: Insufficient documentation

## 2019-11-05 LAB — CBC WITH DIFFERENTIAL/PLATELET
Abs Immature Granulocytes: 0.09 10*3/uL — ABNORMAL HIGH (ref 0.00–0.07)
Basophils Absolute: 0.1 10*3/uL (ref 0.0–0.1)
Basophils Relative: 0 %
Eosinophils Absolute: 0.1 10*3/uL (ref 0.0–0.5)
Eosinophils Relative: 1 %
HCT: 43.2 % (ref 36.0–46.0)
Hemoglobin: 14.3 g/dL (ref 12.0–15.0)
Immature Granulocytes: 1 %
Lymphocytes Relative: 9 %
Lymphs Abs: 1.6 10*3/uL (ref 0.7–4.0)
MCH: 28.4 pg (ref 26.0–34.0)
MCHC: 33.1 g/dL (ref 30.0–36.0)
MCV: 85.9 fL (ref 80.0–100.0)
Monocytes Absolute: 0.6 10*3/uL (ref 0.1–1.0)
Monocytes Relative: 4 %
Neutro Abs: 14.6 10*3/uL — ABNORMAL HIGH (ref 1.7–7.7)
Neutrophils Relative %: 85 %
Platelets: 237 10*3/uL (ref 150–400)
RBC: 5.03 MIL/uL (ref 3.87–5.11)
RDW: 12.2 % (ref 11.5–15.5)
WBC: 17.1 10*3/uL — ABNORMAL HIGH (ref 4.0–10.5)
nRBC: 0 % (ref 0.0–0.2)

## 2019-11-05 LAB — BASIC METABOLIC PANEL
Anion gap: 13 (ref 5–15)
BUN: 11 mg/dL (ref 6–20)
CO2: 22 mmol/L (ref 22–32)
Calcium: 8.9 mg/dL (ref 8.9–10.3)
Chloride: 106 mmol/L (ref 98–111)
Creatinine, Ser: 0.66 mg/dL (ref 0.44–1.00)
GFR calc Af Amer: >60 mL/min (ref >60)
GFR calc non Af Amer: >60 mL/min (ref >60)
Glucose, Bld: 120 mg/dL — ABNORMAL HIGH (ref 70–99)
Potassium: 3.3 mmol/L — ABNORMAL LOW (ref 3.5–5.1)
Sodium: 141 mmol/L (ref 135–145)

## 2019-11-05 LAB — I-STAT BETA HCG BLOOD, ED (MC, WL, AP ONLY): I-stat hCG, quantitative: <5 m[IU]/mL (ref <5)

## 2019-11-05 MED ORDER — TETANUS-DIPHTHERIA TOXOIDS TD 5-2 LFU IM INJ
0.5000 mL | INJECTION | Freq: Once | INTRAMUSCULAR | Status: AC
  Administered 2019-11-05: 0.5 mL via INTRAMUSCULAR
  Filled 2019-11-05: qty 0.5

## 2019-11-05 MED ORDER — ONDANSETRON HCL 4 MG/2ML IJ SOLN
4.0000 mg | Freq: Once | INTRAMUSCULAR | Status: DC
  Filled 2019-11-05: qty 2

## 2019-11-05 MED ORDER — ONDANSETRON HCL 4 MG PO TABS: 4.0000 mg | ORAL_TABLET | Freq: Four times a day (QID) | ORAL | 0 refills | Status: DC

## 2019-11-05 NOTE — Discharge Instructions (Signed)
Please return for any problem.  Follow-up with your regular care provider as instructed.  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

## 2019-11-05 NOTE — ED Provider Notes (Signed)
[facility] DEPT Provider Note   CSN: [id number] Arrival date & time: 11/05/19  1905     History No chief complaint on file.   Stephanie Fields is a 39 y.o. female.  39 year old female with prior medical history as detailed below presents for evaluation following a fall from horse.  Patient reports that she was riding.  She was wearing a helmet.  She fell from the horse and landed on her head and shoulder.  She complains of headache.  She also complains of mild nausea.  She believes that she was probably knocked unconscious for approximately 1 to 2 minutes.  She denies other injury.  She denies other pain.  She was ambulatory after the fall.  The history is provided by the patient and medical records.  Fall This is a new problem. The current episode started less than 1 hour ago. The problem occurs rarely. The problem has been rapidly improving. Nothing aggravates the symptoms. Nothing relieves the symptoms.       Past Medical History:  Diagnosis Date  . Borderline hyperlipidemia   . Depression   . Family history of basal cell carcinoma   . Family history of melanoma   . Family history of pancreatic cancer   . Family history of prostate cancer   . Kidney stones   . Obesity     Patient Active Problem List   Diagnosis Date Noted  . At high risk for breast cancer 08/23/2019  . Genetic testing 07/21/2019  . Family history of prostate cancer   . Family history of pancreatic cancer   . Family history of melanoma   . Family history of basal cell carcinoma   . Vitamin D deficiency 05/17/2019  . IUD (intrauterine device) in place 05/01/2018  . Depressed mood 05/07/2017  . Family history of breast cancer 10/25/2015  . Personal history of asthma 10/25/2015  . Family history of polyps in the colon 10/25/2015    Past Surgical History:  Procedure Laterality Date  . TOOTH EXTRACTION       OB History   No obstetric history on file.      Family History  Problem Relation Age of Onset  . Early death Mother   . Alcohol abuse Mother   . COPD Mother   . Depression Mother   . Diabetes Mother   . Hyperlipidemia Mother   . Hypertension Mother   . Mental illness Mother   . Hypothyroidism Mother   . Depression Father   . Diabetes Father   . Hearing loss Father   . Hyperlipidemia Father   . Hypertension Father   . Colon polyps Father 3  . Prostate cancer Father 25       prostate cancer  . Depression Sister   . Depression Brother   . Learning disabilities Daughter        Autism  . Breast cancer Maternal Aunt 60       negative genetic test  . Heart disease Maternal Grandmother   . Breast cancer Maternal Grandmother 74       thyroid ca, breast ca,squamous ca, basal cell ca, melanoma  . Basal cell carcinoma Maternal Grandmother   . Melanoma Maternal Grandmother 87  . Alcohol abuse Maternal Grandfather   . Diabetes Maternal Grandfather   . Heart attack Maternal Grandfather   . Heart disease Maternal Grandfather   . Hyperlipidemia Maternal Grandfather   . Hypertension Maternal Grandfather   . Arthritis Paternal Grandmother   .  Hearing loss Paternal Grandmother   . Heart attack Paternal Grandmother   . Heart disease Paternal Grandmother   . Hyperlipidemia Paternal Grandmother   . Heart disease Paternal Grandfather   . Hyperlipidemia Paternal Grandfather   . Alzheimer's disease Paternal Grandfather   . Depression Sister   . Depression Brother   . Breast cancer Maternal Aunt 61  . Pancreatic cancer Paternal Uncle 27  . Breast cancer Maternal Aunt 47       negative genetic test  . Breast cancer Maternal Great-grandmother 37    Social History   Tobacco Use  . Smoking status: Never Smoker  . Smokeless tobacco: Never Used  Substance Use Topics  . Alcohol use: Yes    Comment: rarely  . Drug use: Never    Home Medications Prior to Admission medications   Medication Sig Start Date End Date Taking?  Authorizing Provider  cholecalciferol (VITAMIN D3) 25 MCG (1000 UNIT) tablet Take 1,000 Units by mouth daily.   Yes [provider]  FLUoxetine (PROZAC) 20 MG capsule Take 20 mg by mouth daily. 10/13/19  Yes [provider]  levonorgestrel (MIRENA) 20 MCG/24HR IUD by Intrauterine route.   Yes [provider]  montelukast (SINGULAIR) 10 MG tablet Take 1 tablet (10 mg total) by mouth at bedtime. 05/25/19  Yes Orma Flaming, MD  Multiple Vitamin (MULTIVITAMIN) tablet Take 1 tablet by mouth daily.   Yes [provider]  tamoxifen (NOLVADEX) 10 MG tablet Take 1 tablet (10 mg total) by mouth daily. 08/23/19  Yes Nicholas Lose, MD  ondansetron (ZOFRAN) 4 MG tablet Take 1 tablet (4 mg total) by mouth every 6 (six) hours. 11/05/19   Valarie Merino, MD    Allergies    Bupropion  Review of Systems   Review of Systems  All other systems reviewed and are negative.   Physical Exam Updated Vital Signs BP 126/78   Pulse 67   Temp 98 F (36.7 C) (Oral)   Resp 11   SpO2 99%   Physical Exam Vitals and nursing note reviewed.  Constitutional:      General: She is not in acute distress.    Appearance: Normal appearance. She is well-developed.  HENT:     Head: Normocephalic and atraumatic.  Eyes:     Conjunctiva/sclera: Conjunctivae normal.     Pupils: Pupils are equal, round, and reactive to light.  Neck:     Comments: Cervical collar in place. Diffuse mild tenderness over the posterior aspect of the cervical spine noted.  Cardiovascular:     Rate and Rhythm: Normal rate and regular rhythm.     Heart sounds: Normal heart sounds.  Pulmonary:     Effort: Pulmonary effort is normal. No respiratory distress.     Breath sounds: Normal breath sounds.  Abdominal:     General: There is no distension.     Palpations: Abdomen is soft.     Tenderness: There is no abdominal tenderness.  Musculoskeletal:        General: No deformity. Normal range of motion.      Cervical back: Normal range of motion and neck supple.  Skin:    General: Skin is warm and dry.  Neurological:     General: No focal deficit present.     Mental Status: She is alert and oriented to person, place, and time. Mental status is at baseline.     ED Results / Procedures / Treatments   Labs (all labs ordered are listed, but  only abnormal results are displayed) Labs Reviewed  CBC WITH DIFFERENTIAL/PLATELET - Abnormal; Notable for the following components:      Result Value   WBC 17.1 (*)    Neutro Abs 14.6 (*)    Abs Immature Granulocytes 0.09 (*)    All other components within normal limits  BASIC METABOLIC PANEL - Abnormal; Notable for the following components:   Potassium 3.3 (*)    Glucose, Bld 120 (*)    All other components within normal limits  I-STAT BETA HCG BLOOD, ED ([facility], [facility], [facility] ONLY)  TYPE AND SCREEN    EKG None  Radiology DG Chest 2 View  Result Date: 11/05/2019 CLINICAL DATA:  Fall from horse with chest pain, initial encounter EXAM: CHEST - 2 VIEW COMPARISON:  None. FINDINGS: Cardiac shadows within normal limits. The lungs are well aerated bilaterally. No focal infiltrate or sizable effusion is seen. No pneumothorax is noted. No acute bony abnormality is seen. IMPRESSION: No acute abnormality noted. Electronically Signed   By: Inez Catalina M.D.   On: 11/05/2019 21:28   DG Pelvis 1-2 Views  Result Date: 11/05/2019 CLINICAL DATA:  Fall from horse with pelvic pain, initial encounter EXAM: PELVIS - 1-2 VIEW COMPARISON:  None. FINDINGS: Pelvic ring is intact. No acute fracture or dislocation is noted. No soft tissue abnormality is noted. IUD is seen in the pelvis. IMPRESSION: No acute abnormality noted. Electronically Signed   By: Inez Catalina M.D.   On: 11/05/2019 21:33   CT Head Wo Contrast  Result Date: 11/05/2019 CLINICAL DATA:  Fall off horse. Neck pain. Positive loss of consciousness. Nausea and vomiting. EXAM: CT HEAD WITHOUT CONTRAST TECHNIQUE:  Contiguous axial images were obtained from the base of the skull through the vertex without intravenous contrast. COMPARISON:  None. FINDINGS: Brain: No evidence of acute infarction, hemorrhage, hydrocephalus, extra-axial collection or mass lesion/mass effect. Vascular: No hyperdense vessel or unexpected calcification. Skull: No fracture or focal lesion. Sinuses/Orbits: No evidence of acute fracture. Mucosal thickening throughout ethmoid air cells. Bubbly debris in the right maxillary sinus with bilateral maxillary sinus mucosal thickening orbits are unremarkable. Mastoid air cells are clear. Other: None. IMPRESSION: 1. No acute intracranial abnormality. No skull fracture. 2. Paranasal sinus disease with frothy debris in the right maxillary sinus, may be acute or chronic. Electronically Signed   By: Keith Rake M.D.   On: 11/05/2019 20:37   CT Cervical Spine Wo Contrast  Result Date: 11/05/2019 CLINICAL DATA:  Fall off horse. Neck pain. EXAM: CT CERVICAL SPINE WITHOUT CONTRAST TECHNIQUE: Multidetector CT imaging of the cervical spine was performed without intravenous contrast. Multiplanar CT image reconstructions were also generated. COMPARISON:  None. FINDINGS: Alignment: Normal. Skull base and vertebrae: No acute fracture. Vertebral body heights are maintained. The dens and skull base are intact. Soft tissues and spinal canal: No prevertebral fluid or swelling. No visible canal hematoma. Disc levels: Minor endplate spurring at [id number] preservation of disc space. Upper chest: Negative. Other: None. IMPRESSION: No fracture or subluxation of the cervical spine. Electronically Signed   By: Keith Rake M.D.   On: 11/05/2019 20:40    Procedures Procedures (including critical care time)  Medications Ordered in ED Medications  ondansetron [facility]) injection 4 mg (4 mg Intravenous Refused 11/05/19 2035)  tetanus & diphtheria toxoids (adult) (TENIVAC) injection 0.5 mL (0.5 mLs Intramuscular Given 11/05/19  2036)    ED Course  I have reviewed the triage vital signs and the nursing notes.  Pertinent labs & imaging results  that were available during my care of the patient were reviewed by me and considered in my medical decision making (see chart for details).    MDM Rules/Calculators/A&P                      MDM  Screen complete  Cass A Kasprzak was evaluated in Emergency Department on 11/05/2019 for the symptoms described in the history of present illness. She was evaluated in the context of the global COVID-19 pandemic, which necessitated consideration that the patient might be at risk for infection with the SARS-CoV-2 virus that causes COVID-19. Institutional protocols and algorithms that pertain to the evaluation of patients at risk for COVID-19 are in a state of rapid change based on information released by regulatory bodies including the CDC and federal and state organizations. These policies and algorithms were followed during the patient's care in the ED.  Patient is presenting for evaluation following reported fall from horse.  She had a head injury with short LOC.  Symptoms today are suggestive of mild concussion.  Patient's work-up did not reveal evidence of other significant traumatic injury.  Patient now desires discharge home.  Importance of close follow-up is stressed.  Patient given basic concussion instructions.  Importance of clearance with her regular care provider is advised prior to resuming horse riding.  Strict return cautions given and understood. Final Clinical Impression(s) / ED Diagnoses Final diagnoses:  Fall, initial encounter  Closed head injury, initial encounter    Rx / DC Orders ED Discharge Orders         Ordered    ondansetron (ZOFRAN) 4 MG tablet  Every 6 hours     11/05/19 2317           Valarie Merino, MD 11/05/19 2320

## 2019-11-05 NOTE — ED Triage Notes (Signed)
Arrived POV from home patient fell off horse. Patient c/o neck pain and right lower back pain. Patient reports she was wearing helmet but did lose consciousness,nad had nausea and vomiting on the way here.

## 2019-11-06 MED FILL — ONDANSETRON HCL 4 MG TABLET: 4 | 3 days supply | Qty: 12 | Fill #0

## 2019-12-08 MED FILL — TAMOXIFEN 10 MG TABLET: 10 | 90 days supply | Qty: 90 | Fill #1

## 2019-12-20 ENCOUNTER — Encounter: Payer: Self-pay | Admitting: Family Medicine

## 2019-12-20 ENCOUNTER — Other Ambulatory Visit: Payer: Self-pay | Admitting: Family Medicine

## 2019-12-20 MED ORDER — FLUOXETINE HCL 40 MG PO CAPS: 40.0000 mg | ORAL_CAPSULE | Freq: Every day | ORAL | 3 refills | Status: DC

## 2019-12-20 MED FILL — FLUoxetine HCL 40 MG CAPS: 40 | 90 days supply | Qty: 90 | Fill #0

## 2019-12-20 NOTE — Telephone Encounter (Signed)
FYI and Please Advise.

## 2019-12-27 DIAGNOSIS — H0288A Meibomian gland dysfunction right eye, upper and lower eyelids: Secondary | ICD-10-CM | POA: Diagnosis not present

## 2019-12-27 DIAGNOSIS — H16223 Keratoconjunctivitis sicca, not specified as Sjogren's, bilateral: Secondary | ICD-10-CM | POA: Diagnosis not present

## 2019-12-27 DIAGNOSIS — H0288B Meibomian gland dysfunction left eye, upper and lower eyelids: Secondary | ICD-10-CM | POA: Diagnosis not present

## 2019-12-27 MED FILL — XIIDRA 5 % SOLN: 5 | 90 days supply | Qty: 180 | Fill #0

## 2019-12-28 MED FILL — MONTELUKAST SOD 10 MG TAB: 10 | 90 days supply | Qty: 90 | Fill #2

## 2020-03-21 MED FILL — TAMOXIFEN 10 MG TABLET: 10 | 90 days supply | Qty: 90 | Fill #2

## 2020-03-21 MED FILL — MONTELUKAST SOD 10 MG TAB: 10 | 90 days supply | Qty: 90 | Fill #3

## 2020-03-21 MED FILL — FLUoxetine HCL 40 MG CAPS: 40 | 90 days supply | Qty: 90 | Fill #1

## 2020-06-12 ENCOUNTER — Other Ambulatory Visit: Payer: Self-pay | Admitting: Gastroenterology

## 2020-06-12 DIAGNOSIS — Z1159 Encounter for screening for other viral diseases: Secondary | ICD-10-CM

## 2020-07-04 ENCOUNTER — Other Ambulatory Visit: Payer: Self-pay | Admitting: Family Medicine

## 2020-07-04 MED FILL — FLUoxetine HCL 40 MG CAPS: 40 | 90 days supply | Qty: 90 | Fill #2

## 2020-07-04 MED FILL — MONTELUKAST SOD 10 MG TAB: 10 | 90 days supply | Qty: 90 | Fill #0

## 2020-07-04 MED FILL — TAMOXIFEN 10 MG TABLET: 10 | 90 days supply | Qty: 90 | Fill #3

## 2020-07-27 ENCOUNTER — Telehealth: Payer: Self-pay | Admitting: Hematology and Oncology

## 2020-07-27 NOTE — Telephone Encounter (Signed)
Rescheduled per provider. Called pt and left a msg 

## 2020-08-22 ENCOUNTER — Ambulatory Visit: Payer: 59 | Admitting: Hematology and Oncology

## 2020-08-30 ENCOUNTER — Inpatient Hospital Stay: Payer: 59 | Admitting: Hematology and Oncology

## 2020-09-07 ENCOUNTER — Other Ambulatory Visit: Payer: Self-pay | Admitting: Hematology and Oncology

## 2020-09-07 DIAGNOSIS — Z1231 Encounter for screening mammogram for malignant neoplasm of breast: Secondary | ICD-10-CM

## 2020-09-10 NOTE — Progress Notes (Signed)
HEMATOLOGY-ONCOLOGY MYCHART VIDEO VISIT PROGRESS NOTE  I connected with Stephanie Fields on 09/11/2020 at  8:15 AM EST by MyChart video conference and verified that I am speaking with the correct person using two identifiers.  I discussed the limitations, risks, security and privacy concerns of performing an evaluation and management service by MyChart and the availability of in person appointments.  I also discussed with the patient that there may be a patient responsible charge related to this service. The patient expressed understanding and agreed to proceed.  Patient's Location: Home  CHIEF COMPLIANT: Follow-up of high risk for breast cancer  INTERVAL HISTORY: Stephanie Fields is a 40 y.o. female with above-mentioned history of high risk for breast cancer. Mammogram on 08/25/19 showed no evidence of malignancy bilaterally. She presents over MyChart today for follow-up.  She is tolerating tamoxifen extremely well without any problems or concerns.  Denies any hot flashes or arthralgias or myalgias.  Denies any leg cramps.  Breast MRI could not be accomplished because of timing issues.  She has a mammogram scheduled for 09/21/2020.  Denies any pain lumps or nodules in the breast.   Observations/Objective:  There were no vitals filed for this visit. There is no height or weight on file to calculate BMI.  I have reviewed the data as listed CMP Latest Ref Rng & Units 11/05/2019 05/14/2019 05/01/2018  Glucose 70 - 99 mg/dL 120(H) 84 88  BUN 6 - 20 mg/dL 11 15 13   Creatinine 0.44 - 1.00 mg/dL 0.66 0.74 0.70  Sodium 135 - 145 mmol/L 141 142 141  Potassium 3.5 - 5.1 mmol/L 3.3(L) 3.7 3.8  Chloride 98 - 111 mmol/L 106 105 105  CO2 22 - 32 mmol/L 22 23 30   Calcium 8.9 - 10.3 mg/dL 8.9 9.5 9.4  Total Protein 6.1 - 8.1 g/dL - 6.9 7.5  Total Bilirubin 0.2 - 1.2 mg/dL - 0.6 0.6  Alkaline Phos 39 - 117 U/L - - 76  AST 10 - 30 U/L - 12 15  ALT 6 - 29 U/L - 11 16    Lab Results   Component Value Date   WBC 17.1 (H) 11/05/2019   HGB 14.3 11/05/2019   HCT 43.2 11/05/2019   MCV 85.9 11/05/2019   PLT 237 11/05/2019   NEUTROABS 14.6 (H) 11/05/2019      Assessment Plan:  At high risk for breast cancer Dr. Harrell Gave is a cardiologist at our hospital. Genetic testing 07/19/2019: No pathogenic variants or mutations identified Tyrer Cusick: Lifetime risk: 21% (population risk: 12.9%) 10-year risk: 2.7% (population risk: 1.6%) Baker Janus model: 5-year risk developing breast cancer 0.6%   Tamoxifen toxicities: Tolerating it extremely well without any problems or concerns.  Denies any hot flashes or muscle cramps. She has a Mirena IUD.  Breast cancer surveillance: Mammogram scheduled for 09/21/2020 We will push the breast MRI to July 2022.  Return to clinic in 1 year with a MyChart virtual visit   I discussed the assessment and treatment plan with the patient. The patient was provided an opportunity to ask questions and all were answered. The patient agreed with the plan and demonstrated an understanding of the instructions. The patient was advised to call back or seek an in-person evaluation if the symptoms worsen or if the condition fails to improve as anticipated.   I provided 20 minutes of face-to-face MyChart video visit time during this encounter.    Rulon Eisenmenger, MD 09/11/2020   I, Molly Dorshimer, am acting as Education administrator  for Nicholas Lose, MD.  I have reviewed the above documentation for accuracy and completeness, and I agree with the above.

## 2020-09-11 ENCOUNTER — Inpatient Hospital Stay: Payer: 59 | Attending: Hematology and Oncology | Admitting: Hematology and Oncology

## 2020-09-11 DIAGNOSIS — Z79810 Long term (current) use of selective estrogen receptor modulators (SERMs): Secondary | ICD-10-CM | POA: Diagnosis not present

## 2020-09-11 DIAGNOSIS — Z975 Presence of (intrauterine) contraceptive device: Secondary | ICD-10-CM | POA: Diagnosis not present

## 2020-09-11 DIAGNOSIS — Z1239 Encounter for other screening for malignant neoplasm of breast: Secondary | ICD-10-CM | POA: Diagnosis not present

## 2020-09-11 DIAGNOSIS — Z803 Family history of malignant neoplasm of breast: Secondary | ICD-10-CM | POA: Diagnosis not present

## 2020-09-11 DIAGNOSIS — Z9189 Other specified personal risk factors, not elsewhere classified: Secondary | ICD-10-CM | POA: Diagnosis not present

## 2020-09-11 NOTE — Assessment & Plan Note (Addendum)
Dr. Harrell Gave is a cardiologist at our hospital. Genetic testing 07/19/2019: No pathogenic variants or mutations identified Tyrer Cusick: Lifetime risk: 21% (population risk: 12.9%) 10-year risk: 2.7% (population risk: 1.6%) Baker Janus model: 5-year risk developing breast cancer 0.6%   Tamoxifen toxicities: Tolerating it extremely well without any problems or concerns.  Denies any hot flashes or muscle cramps. She has a Mirena IUD.  Breast cancer surveillance: Mammogram scheduled for 09/21/2020 We will push the breast MRI to July 2022.  Return to clinic in 1 year with a MyChart virtual visit

## 2020-09-21 ENCOUNTER — Ambulatory Visit
Admission: RE | Admit: 2020-09-21 | Discharge: 2020-09-21 | Disposition: A | Discharge status: Home / Self Care | Payer: 59 | Source: Ambulatory Visit

## 2020-09-21 ENCOUNTER — Other Ambulatory Visit: Payer: Self-pay

## 2020-09-21 DIAGNOSIS — Z1231 Encounter for screening mammogram for malignant neoplasm of breast: Secondary | ICD-10-CM | POA: Diagnosis not present

## 2020-09-22 ENCOUNTER — Other Ambulatory Visit: Payer: Self-pay | Admitting: Hematology and Oncology

## 2020-09-22 DIAGNOSIS — R928 Other abnormal and inconclusive findings on diagnostic imaging of breast: Secondary | ICD-10-CM

## 2020-10-06 ENCOUNTER — Other Ambulatory Visit: Payer: Self-pay

## 2020-10-06 ENCOUNTER — Ambulatory Visit
Admission: RE | Admit: 2020-10-06 | Discharge: 2020-10-06 | Disposition: A | Discharge status: Home / Self Care | Payer: 59 | Source: Ambulatory Visit | Attending: Hematology and Oncology | Admitting: Hematology and Oncology

## 2020-10-06 ENCOUNTER — Ambulatory Visit: Payer: 59

## 2020-10-06 DIAGNOSIS — R928 Other abnormal and inconclusive findings on diagnostic imaging of breast: Secondary | ICD-10-CM

## 2020-10-06 DIAGNOSIS — R922 Inconclusive mammogram: Secondary | ICD-10-CM | POA: Diagnosis not present

## 2020-10-12 ENCOUNTER — Other Ambulatory Visit: Payer: Self-pay | Admitting: Hematology and Oncology

## 2020-10-12 MED FILL — FLUoxetine HCL 40 MG CAPS: 40 | 90 days supply | Qty: 90 | Fill #3

## 2020-10-12 MED FILL — TAMOXIFEN CITRATE 10 MG TAB: 10 | 90 days supply | Qty: 90 | Fill #0

## 2020-10-12 MED FILL — MONTELUKAST SOD 10 MG TAB: 10 | 90 days supply | Qty: 90 | Fill #1

## 2020-12-15 ENCOUNTER — Other Ambulatory Visit: Payer: Self-pay | Admitting: Family Medicine

## 2020-12-15 ENCOUNTER — Other Ambulatory Visit (HOSPITAL_COMMUNITY): Payer: Self-pay

## 2020-12-15 MED ORDER — FLUOXETINE HCL 40 MG PO CAPS
40.0000 mg | ORAL_CAPSULE | Freq: Every day | ORAL | 3 refills | Status: DC
  Filled 2020-12-15 – 2021-01-29 (×2): qty 90, 90d supply, fill #0
  Filled 2021-04-26: qty 90, 90d supply, fill #1
  Filled 2021-08-15: qty 90, 90d supply, fill #0
  Filled 2021-11-26: qty 90, 90d supply, fill #1

## 2021-01-29 ENCOUNTER — Other Ambulatory Visit: Payer: Self-pay | Admitting: Hematology and Oncology

## 2021-01-29 ENCOUNTER — Other Ambulatory Visit (HOSPITAL_COMMUNITY): Payer: Self-pay

## 2021-01-29 MED ORDER — TAMOXIFEN CITRATE 10 MG PO TABS
10.0000 mg | ORAL_TABLET | Freq: Every day | ORAL | 3 refills | Status: DC
  Filled 2021-01-29: qty 90, 90d supply, fill #0
  Filled 2021-04-26: qty 90, 90d supply, fill #1
  Filled 2021-08-15: qty 90, 90d supply, fill #0
  Filled 2021-11-26: qty 90, 90d supply, fill #1

## 2021-01-29 MED FILL — Montelukast Sodium Tab 10 MG (Base Equiv): ORAL | 90 days supply | Qty: 90 | Fill #0 | Status: AC

## 2021-01-31 ENCOUNTER — Other Ambulatory Visit (HOSPITAL_COMMUNITY): Payer: Self-pay

## 2021-04-26 ENCOUNTER — Other Ambulatory Visit (HOSPITAL_COMMUNITY): Payer: Self-pay

## 2021-04-26 ENCOUNTER — Other Ambulatory Visit (HOSPITAL_BASED_OUTPATIENT_CLINIC_OR_DEPARTMENT_OTHER): Payer: Self-pay

## 2021-04-26 MED FILL — Montelukast Sodium Tab 10 MG (Base Equiv): ORAL | 90 days supply | Qty: 90 | Fill #1 | Status: AC

## 2021-04-27 ENCOUNTER — Other Ambulatory Visit (HOSPITAL_COMMUNITY): Payer: Self-pay

## 2021-05-14 ENCOUNTER — Other Ambulatory Visit: Payer: Self-pay | Admitting: Hematology and Oncology

## 2021-05-14 DIAGNOSIS — Z9189 Other specified personal risk factors, not elsewhere classified: Secondary | ICD-10-CM

## 2021-05-26 ENCOUNTER — Ambulatory Visit
Admission: RE | Admit: 2021-05-26 | Discharge: 2021-05-26 | Disposition: A | Discharge status: Home / Self Care | Payer: 59 | Source: Ambulatory Visit | Attending: Hematology and Oncology | Admitting: Hematology and Oncology

## 2021-05-26 ENCOUNTER — Other Ambulatory Visit: Payer: Self-pay

## 2021-05-26 DIAGNOSIS — N6489 Other specified disorders of breast: Secondary | ICD-10-CM | POA: Diagnosis not present

## 2021-05-26 DIAGNOSIS — Z9189 Other specified personal risk factors, not elsewhere classified: Secondary | ICD-10-CM

## 2021-05-26 DIAGNOSIS — Z803 Family history of malignant neoplasm of breast: Secondary | ICD-10-CM | POA: Diagnosis not present

## 2021-05-26 MED ORDER — GADOBUTROL 1 MMOL/ML IV SOLN
10.0000 mL | Freq: Once | INTRAVENOUS | Status: AC | PRN
  Administered 2021-05-26: 10 mL via INTRAVENOUS

## 2021-05-28 ENCOUNTER — Other Ambulatory Visit: Payer: Self-pay | Admitting: Hematology and Oncology

## 2021-05-28 DIAGNOSIS — Z9189 Other specified personal risk factors, not elsewhere classified: Secondary | ICD-10-CM

## 2021-05-29 ENCOUNTER — Other Ambulatory Visit: Payer: Self-pay

## 2021-05-29 ENCOUNTER — Ambulatory Visit
Admission: RE | Admit: 2021-05-29 | Discharge: 2021-05-29 | Disposition: A | Discharge status: Home / Self Care | Payer: 59 | Source: Ambulatory Visit | Attending: Hematology and Oncology | Admitting: Hematology and Oncology

## 2021-05-29 DIAGNOSIS — Z9189 Other specified personal risk factors, not elsewhere classified: Secondary | ICD-10-CM

## 2021-07-25 ENCOUNTER — Ambulatory Visit (INDEPENDENT_AMBULATORY_CARE_PROVIDER_SITE_OTHER): Payer: 59 | Admitting: Obstetrics & Gynecology

## 2021-07-25 ENCOUNTER — Other Ambulatory Visit (HOSPITAL_COMMUNITY)
Admission: RE | Admit: 2021-07-25 | Discharge: 2021-07-25 | Disposition: A | Discharge status: Home / Self Care | Payer: 59 | Source: Ambulatory Visit | Attending: Obstetrics & Gynecology | Admitting: Obstetrics & Gynecology

## 2021-07-25 ENCOUNTER — Other Ambulatory Visit: Payer: Self-pay

## 2021-07-25 ENCOUNTER — Encounter (HOSPITAL_BASED_OUTPATIENT_CLINIC_OR_DEPARTMENT_OTHER): Payer: Self-pay | Admitting: Obstetrics & Gynecology

## 2021-07-25 VITALS — BP 134/78 | HR 66 | Ht 66.25 in | Wt 222.2 lb

## 2021-07-25 DIAGNOSIS — Z124 Encounter for screening for malignant neoplasm of cervix: Secondary | ICD-10-CM | POA: Insufficient documentation

## 2021-07-25 DIAGNOSIS — T8332XA Displacement of intrauterine contraceptive device, initial encounter: Secondary | ICD-10-CM

## 2021-07-25 DIAGNOSIS — N8189 Other female genital prolapse: Secondary | ICD-10-CM

## 2021-07-25 DIAGNOSIS — Z01419 Encounter for gynecological examination (general) (routine) without abnormal findings: Secondary | ICD-10-CM

## 2021-07-25 DIAGNOSIS — Z1231 Encounter for screening mammogram for malignant neoplasm of breast: Secondary | ICD-10-CM

## 2021-07-25 DIAGNOSIS — Z9189 Other specified personal risk factors, not elsewhere classified: Secondary | ICD-10-CM | POA: Diagnosis not present

## 2021-07-25 DIAGNOSIS — Z8742 Personal history of other diseases of the female genital tract: Secondary | ICD-10-CM

## 2021-07-25 DIAGNOSIS — Z0000 Encounter for general adult medical examination without abnormal findings: Secondary | ICD-10-CM

## 2021-07-25 NOTE — Progress Notes (Signed)
40 y.o. G75P4 Married White or Caucasian female here for annual exam.  H/o menorrhagia.  Has Mirena IUD.  Is starting to bleed again.  Would like this placed again.  Was placed about 8 years ago.  Has strong family history of breast cancer.  Followed by Dr. Lindi Adie.  She is on Tamoxifen for 5 years.  Having MMG and yearly MRI.  Did have negative genetic testing 06/2019.  Discussed pregnancy history.  Discussed pelvic PT.  Does have some urinary urgency.  She is going to let me know if/when desires referral.  Has farm and horses so very active.  Patient's last menstrual period was 07/25/2021.          Sexually active: Yes.    The current method of family planning is vasectomy.    Exercising: busy with work on farm Smoker:  no  Health Maintenance: Pap:  obtained today History of abnormal Pap:  no MMG:  10/06/20 neg Colonoscopy:  guidelines reviewed Screening Labs: obtained today   reports that she has never smoked. She has never used smokeless tobacco. She reports current alcohol use. She reports that she does not use drugs.  Past Medical History:  Diagnosis Date   Borderline hyperlipidemia    Depression    Family history of basal cell carcinoma    Family history of melanoma    Family history of pancreatic cancer    Family history of prostate cancer    Kidney stones    Obesity     Past Surgical History:  Procedure Laterality Date   TOOTH EXTRACTION      Current Outpatient Medications  Medication Sig Dispense Refill   cholecalciferol (VITAMIN D3) 25 MCG (1000 UNIT) tablet Take 1,000 Units by mouth daily.     FLUoxetine (PROZAC) 40 MG capsule Take 1 capsule (40 mg total) by mouth daily. 90 capsule 3   levonorgestrel (MIRENA) 20 MCG/24HR IUD by Intrauterine route.     montelukast (SINGULAIR) 10 MG tablet Take 1 tablet (10 mg total) by mouth at bedtime. 90 tablet 3   Multiple Vitamin (MULTIVITAMIN) tablet Take 1 tablet by mouth daily.     tamoxifen (NOLVADEX) 10 MG tablet Take 1  tablet (10 mg total) by mouth daily. 90 tablet 3   No current facility-administered medications for this visit.    Family History  Problem Relation Age of Onset   Early death Mother    Alcohol abuse Mother    COPD Mother    Depression Mother    Diabetes Mother    Hyperlipidemia Mother    Hypertension Mother    Mental illness Mother    Hypothyroidism Mother    Depression Father    Diabetes Father    Hearing loss Father    Hyperlipidemia Father    Hypertension Father    Colon polyps Father 41   Prostate cancer Father 52       prostate cancer   Depression Sister    Depression Brother    Learning disabilities Daughter        Autism   Breast cancer Maternal Aunt 60       negative genetic test   Heart disease Maternal Grandmother    Breast cancer Maternal Grandmother 74       thyroid ca, breast ca,squamous ca, basal cell ca, melanoma   Basal cell carcinoma Maternal Grandmother    Melanoma Maternal Grandmother 92   Alcohol abuse Maternal Grandfather    Diabetes Maternal Grandfather    Heart attack Maternal  Grandfather    Heart disease Maternal Grandfather    Hyperlipidemia Maternal Grandfather    Hypertension Maternal Grandfather    Arthritis Paternal Grandmother    Hearing loss Paternal Grandmother    Heart attack Paternal Grandmother    Heart disease Paternal Grandmother    Hyperlipidemia Paternal Grandmother    Heart disease Paternal Grandfather    Hyperlipidemia Paternal Grandfather    Alzheimer's disease Paternal Grandfather    Depression Sister    Depression Brother    Breast cancer Maternal Aunt 61   Pancreatic cancer Paternal Uncle 64   Breast cancer Maternal Aunt 47       negative genetic test   Breast cancer Maternal Great-grandmother 40    Review of Systems  All other systems reviewed and are negative.  Exam:   BP 134/78   Pulse 66   Ht 5' 6.25" (1.683 m)   Wt 222 lb 3.2 oz (100.8 kg)   LMP 07/25/2021   BMI 35.59 kg/m   Height: 5' 6.25" (168.3  cm)  General appearance: alert, cooperative and appears stated age Head: Normocephalic, without obvious abnormality, atraumatic Neck: no adenopathy, supple, symmetrical, trachea midline and thyroid normal to inspection and palpation Lungs: clear to auscultation bilaterally Breasts: normal appearance, no masses or tenderness Heart: regular rate and rhythm Abdomen: soft, non-tender; bowel sounds normal; no masses,  no organomegaly Extremities: extremities normal, atraumatic, no cyanosis or edema Skin: Skin color, texture, turgor normal. No rashes or lesions Lymph nodes: Cervical, supraclavicular, and axillary nodes normal. No abnormal inguinal nodes palpated Neurologic: Grossly normal   Pelvic: External genitalia:  no lesions              Urethra:  normal appearing urethra with no masses, tenderness or lesions              Bartholins and Skenes: normal                 Vagina: normal appearing vagina with normal color and no discharge, no lesions              Cervix: no lesions, no IUD string noted              Pap taken: Yes.   Bimanual Exam:  Uterus:  about 10 weeks size, mobile              Adnexa: normal adnexa and no mass, fullness, tenderness               Rectovaginal: Confirms               Anus:  normal sphincter tone, no lesions  Procedure:  speculum placed.  Cervix visualized.  Cervix cleansed with Betadine x 3.  Single toothed tenaculum applied to anterior lip of cervix.  Using IUD string hook, this was passed into endometrial cavity and rotated clockwise and removed several times.  No string was every visible.  Procedure stopped after multiple attempts.  Pt tolerated procedure well.  Small bleeding from tenaculum site was made hemostatic with silver nitrate.  Instruments all removed.  Chaperone, Octaviano Batty, CMA, was present for exam.  Assessment/Plan: 1. Well woman exam with routine gynecological exam - pap and HR HPV obtained today - order for screening MMG placed. -  colon cancer screening reviewed - lab work ordered - care gaps reviewed/vaccines updated.  Pt has completed Covid vaccination but I do not have dates for this  2. History of menorrhagia - has Mirena IUD in placed -  failed removal today with non visualized strings - will have pt return and remove/replaced with ultrasound guidance  3. At high risk for breast cancer - on Tamoxifen - followed by Dr. Lindi Adie - having yearly diagnostic breast MRI  4. Pelvic relaxation - pelvic PT discussed  5. Blood tests for routine general physical examination - CBC - Comprehensive metabolic panel - TSH - VITAMIN D 25 Hydroxy (Vit-D Deficiency, Fractures) - Lipid panel

## 2021-07-26 LAB — CBC
Hematocrit: 42 % (ref 34.0–46.6)
Hemoglobin: 14.2 g/dL (ref 11.1–15.9)
MCH: 28.9 pg (ref 26.6–33.0)
MCHC: 33.8 g/dL (ref 31.5–35.7)
MCV: 85 fL (ref 79–97)
Platelets: 255 10*3/uL (ref 150–450)
RBC: 4.92 x10E6/uL (ref 3.77–5.28)
RDW: 12.3 % (ref 11.7–15.4)
WBC: 8 10*3/uL (ref 3.4–10.8)

## 2021-07-26 LAB — COMPREHENSIVE METABOLIC PANEL
ALT: 23 IU/L (ref 0–32)
AST: 22 IU/L (ref 0–40)
Albumin/Globulin Ratio: 2.1 (ref 1.2–2.2)
Albumin: 4.5 g/dL (ref 3.8–4.8)
Alkaline Phosphatase: 77 IU/L (ref 44–121)
BUN/Creatinine Ratio: 17 (ref 9–23)
BUN: 13 mg/dL (ref 6–24)
Bilirubin Total: 0.3 mg/dL (ref 0.0–1.2)
CO2: 24 mmol/L (ref 20–29)
Calcium: 9 mg/dL (ref 8.7–10.2)
Chloride: 101 mmol/L (ref 96–106)
Creatinine, Ser: 0.76 mg/dL (ref 0.57–1.00)
Globulin, Total: 2.1 g/dL (ref 1.5–4.5)
Glucose: 96 mg/dL (ref 70–99)
Potassium: 3.9 mmol/L (ref 3.5–5.2)
Sodium: 139 mmol/L (ref 134–144)
Total Protein: 6.6 g/dL (ref 6.0–8.5)
eGFR: 100 mL/min/{1.73_m2} (ref >59)

## 2021-07-26 LAB — LIPID PANEL
Chol/HDL Ratio: 5.5 ratio — ABNORMAL HIGH (ref 0.0–4.4)
Cholesterol, Total: 232 mg/dL — ABNORMAL HIGH (ref 100–199)
HDL: 42 mg/dL (ref >39)
LDL Chol Calc (NIH): 167 mg/dL — ABNORMAL HIGH (ref 0–99)
Triglycerides: 126 mg/dL (ref 0–149)
VLDL Cholesterol Cal: 23 mg/dL (ref 5–40)

## 2021-07-26 LAB — VITAMIN D 25 HYDROXY (VIT D DEFICIENCY, FRACTURES): Vit D, 25-Hydroxy: 28 ng/mL — ABNORMAL LOW (ref 30.0–100.0)

## 2021-07-26 LAB — TSH: TSH: 1.79 u[IU]/mL (ref 0.450–4.500)

## 2021-07-27 LAB — CYTOLOGY - PAP
Comment: NEGATIVE
Diagnosis: NEGATIVE
High risk HPV: NEGATIVE

## 2021-08-01 ENCOUNTER — Other Ambulatory Visit (HOSPITAL_BASED_OUTPATIENT_CLINIC_OR_DEPARTMENT_OTHER): Payer: Self-pay | Admitting: Obstetrics & Gynecology

## 2021-08-01 DIAGNOSIS — T8332XD Displacement of intrauterine contraceptive device, subsequent encounter: Secondary | ICD-10-CM

## 2021-08-08 ENCOUNTER — Other Ambulatory Visit (HOSPITAL_COMMUNITY): Payer: Self-pay

## 2021-08-08 MED ORDER — CARESTART COVID-19 HOME TEST VI KIT
PACK | 0 refills | Status: DC
  Filled 2021-08-08: qty 4, 4d supply, fill #0

## 2021-08-15 ENCOUNTER — Other Ambulatory Visit (HOSPITAL_BASED_OUTPATIENT_CLINIC_OR_DEPARTMENT_OTHER): Payer: Self-pay

## 2021-08-15 ENCOUNTER — Other Ambulatory Visit: Payer: Self-pay | Admitting: Family Medicine

## 2021-08-15 MED ORDER — MONTELUKAST SODIUM 10 MG PO TABS
ORAL_TABLET | ORAL | 3 refills | Status: DC
  Filled 2021-08-15: qty 90, 90d supply, fill #0
  Filled 2021-11-26: qty 90, 90d supply, fill #1
  Filled 2022-02-28: qty 90, 90d supply, fill #2
  Filled 2022-06-11: qty 90, 90d supply, fill #3

## 2021-08-16 ENCOUNTER — Other Ambulatory Visit (HOSPITAL_BASED_OUTPATIENT_CLINIC_OR_DEPARTMENT_OTHER): Payer: Self-pay

## 2021-09-05 ENCOUNTER — Other Ambulatory Visit: Payer: Self-pay

## 2021-09-05 ENCOUNTER — Ambulatory Visit (INDEPENDENT_AMBULATORY_CARE_PROVIDER_SITE_OTHER): Payer: 59

## 2021-09-05 ENCOUNTER — Ambulatory Visit (INDEPENDENT_AMBULATORY_CARE_PROVIDER_SITE_OTHER): Payer: 59 | Admitting: Obstetrics & Gynecology

## 2021-09-05 ENCOUNTER — Other Ambulatory Visit (HOSPITAL_BASED_OUTPATIENT_CLINIC_OR_DEPARTMENT_OTHER): Payer: Self-pay | Admitting: Obstetrics & Gynecology

## 2021-09-05 DIAGNOSIS — D251 Intramural leiomyoma of uterus: Secondary | ICD-10-CM

## 2021-09-05 DIAGNOSIS — Z8742 Personal history of other diseases of the female genital tract: Secondary | ICD-10-CM

## 2021-09-05 DIAGNOSIS — Z30431 Encounter for routine checking of intrauterine contraceptive device: Secondary | ICD-10-CM

## 2021-09-05 DIAGNOSIS — T8332XD Displacement of intrauterine contraceptive device, subsequent encounter: Secondary | ICD-10-CM

## 2021-09-05 DIAGNOSIS — Z30433 Encounter for removal and reinsertion of intrauterine contraceptive device: Secondary | ICD-10-CM

## 2021-09-05 DIAGNOSIS — T8332XA Displacement of intrauterine contraceptive device, initial encounter: Secondary | ICD-10-CM

## 2021-09-08 NOTE — Progress Notes (Signed)
41 y.o. G14P0104 Married Caucasian female presents for removal of Mirena IUD.  Attempt made in office prior to today.  IUD has non-visualized strings.  Pt has Mirena IUD for bleeding control and desires to continue.  Spouse had vasectomy for contraception.  Pt has also been counseled about risks and benefits as well as complications.  Consent is obtained today.  Due to inability to remove IUD with prior attempt, ultrasound guidance is scheduled today.  All questions answered prior to start of procedure.   Ultrasound prior to procedure did confirm presence of IUD as well as two intramural fibroids.  Recent STD testing:  n/a LMP:  No LMP recorded. (Menstrual status: IUD).  Patient Active Problem List   Diagnosis Date Noted   At high risk for breast cancer 08/23/2019   Genetic testing 07/21/2019   Family history of prostate cancer    Family history of pancreatic cancer    Family history of melanoma    Family history of basal cell carcinoma    Vitamin D deficiency 05/17/2019   IUD (intrauterine device) in place 05/01/2018   Depressed mood 05/07/2017   Family history of breast cancer 10/25/2015   Personal history of asthma 10/25/2015   Family history of polyps in the colon 10/25/2015   History of gestational diabetes mellitus 10/25/2015   Past Medical History:  Diagnosis Date   Borderline hyperlipidemia    Depression    Family history of basal cell carcinoma    Family history of melanoma    Family history of pancreatic cancer    Family history of prostate cancer    Kidney stones    Obesity    Current Outpatient Medications on File Prior to Visit  Medication Sig Dispense Refill   cholecalciferol (VITAMIN D3) 25 MCG (1000 UNIT) tablet Take 1,000 Units by mouth daily.     COVID-19 At Home Antigen Test [facility] COVID-19 HOME TEST) KIT Use as directed. 4 each 0   FLUoxetine (PROZAC) 40 MG capsule Take 1 capsule (40 mg total) by mouth daily. 90 capsule 3   levonorgestrel (MIRENA) 20  MCG/24HR IUD by Intrauterine route.     montelukast (SINGULAIR) 10 MG tablet Take 1 tablet (10 mg total) by mouth at bedtime. 90 tablet 3   montelukast (SINGULAIR) 10 MG tablet Take 1 tablet by mouth once daily 90 tablet 3   Multiple Vitamin (MULTIVITAMIN) tablet Take 1 tablet by mouth daily.     tamoxifen (NOLVADEX) 10 MG tablet Take 1 tablet (10 mg total) by mouth daily. 90 tablet 3   No current facility-administered medications on file prior to visit.    Review of Systems  All other systems reviewed and are negative.  Gen:  [facility] healthy female NAD Abdomen: soft, non-tender Groin:  no inguinal nodes palpated  Pelvic exam: Vulva:  normal female genitalia Vagina:  normal vagina Cervix:  Non-tender, Negative CMT, no lesions or redness. Uterus:  normal shape, position and consistency   Procedure:  Speculum reinserted.  Cervix visualized and cleansed with Betadine x 3.  Paracervical block was not placed.  Single toothed tenaculum applied to anterior lip of cervix without difficulty.  Endometrial curette passed to fundus with ultrasound guidelines.  Several attempts made to grab IUD with curette.  IUD was removed with final attempt and without difficulty.  Pt did not have any cramping and tolerated this well.  Then uterus sounded to 10cm. Mirena IUD package was opened.  IUD and introducer passed to fundus and then withdrawn slightly before  IUD was passed into endometrial cavity.  Introducer removed.  Strings cut to 2cm.  Tenaculum removed from cervix.  Minimal bleeding noted.  Pt tolerated the procedure well.  All instruments removed from vagina.  Assessment/Plan: 1. Encounter for IUD removal and reinsertion - IUD was successfully removed and replaced today.  Pt knows to call with any concern/questions  2. History of menorrhagia  3. Intramural leiomyoma of uterus

## 2021-10-03 ENCOUNTER — Other Ambulatory Visit: Payer: Self-pay

## 2021-10-03 ENCOUNTER — Other Ambulatory Visit (HOSPITAL_BASED_OUTPATIENT_CLINIC_OR_DEPARTMENT_OTHER): Payer: Self-pay

## 2021-10-03 ENCOUNTER — Ambulatory Visit: Payer: 59 | Admitting: Family Medicine

## 2021-10-03 ENCOUNTER — Encounter: Payer: Self-pay | Admitting: Family Medicine

## 2021-10-03 VITALS — BP 132/92 | HR 79 | Temp 98.2°F | Ht 67.0 in | Wt 223.1 lb

## 2021-10-03 DIAGNOSIS — R4589 Other symptoms and signs involving emotional state: Secondary | ICD-10-CM

## 2021-10-03 DIAGNOSIS — Z6834 Body mass index (BMI) 34.0-34.9, adult: Secondary | ICD-10-CM

## 2021-10-03 DIAGNOSIS — J3089 Other allergic rhinitis: Secondary | ICD-10-CM

## 2021-10-03 DIAGNOSIS — E6609 Other obesity due to excess calories: Secondary | ICD-10-CM | POA: Diagnosis not present

## 2021-10-03 DIAGNOSIS — Z8632 Personal history of gestational diabetes: Secondary | ICD-10-CM

## 2021-10-03 DIAGNOSIS — E8881 Metabolic syndrome: Secondary | ICD-10-CM | POA: Diagnosis not present

## 2021-10-03 LAB — HEMOGLOBIN A1C: Hgb A1c MFr Bld: 5.3 % (ref 4.6–6.5)

## 2021-10-03 MED ORDER — OZEMPIC (0.25 OR 0.5 MG/DOSE) 2 MG/1.5ML ~~LOC~~ SOPN
0.5000 mg | PEN_INJECTOR | SUBCUTANEOUS | 0 refills | Status: DC
  Filled 2021-10-03: qty 1.5, 28d supply, fill #0

## 2021-10-03 NOTE — Progress Notes (Signed)
Subjective:     Patient ID: Stephanie Fields, female    DOB: [birth year], 41 y.o.   MRN: [id number]  Chief Complaint  Patient presents with   Transfer of Care    HPI From Dr. Rogers Blocker  Metabolic syndrome-Had GDM x 2.  FH DM-eats well.  Has farm so active.  WW in past, NOOM, Rickard Patience, whole 30, intermitt fasting. No meds in past.  Highest wt was 235.  Lowest 202 last yr. Has worked out w/trainer. Sister lives w/her and is a Engineer, maintenance (IT). Event organiser about wt and health.   Had preeclampsia w/4th pregnancy.   Would like to try meds Allergic rhinitis-wheezing at times.  Asthma as child.   Adjustment disorder-on prozac-helps.  Stressful job.  No SI FH breast CA-on tomoxifen  Health Maintenance Due  Topic Date Due   Hepatitis C Screening  Never done   COVID-19 Vaccine (2 - Pfizer series) 09/05/2019    Past Medical History:  Diagnosis Date   Borderline hyperlipidemia    Depression    Family history of basal cell carcinoma    Family history of breast cancer    Family history of melanoma    Family history of pancreatic cancer    Family history of prostate cancer    Kidney stones    Obesity     Past Surgical History:  Procedure Laterality Date   TOOTH EXTRACTION      Outpatient Medications Prior to Visit  Medication Sig Dispense Refill   cholecalciferol (VITAMIN D3) 25 MCG (1000 UNIT) tablet Take 1,000 Units by mouth daily.     FLUoxetine (PROZAC) 40 MG capsule Take 1 capsule (40 mg total) by mouth daily. 90 capsule 3   levonorgestrel (MIRENA) 20 MCG/24HR IUD by Intrauterine route.     montelukast (SINGULAIR) 10 MG tablet Take 1 tablet by mouth once daily 90 tablet 3   tamoxifen (NOLVADEX) 10 MG tablet Take 1 tablet (10 mg total) by mouth daily. 90 tablet 3   COVID-19 At Home Antigen Test ([facility] COVID-19 HOME TEST) KIT Use as directed. 4 each 0   montelukast (SINGULAIR) 10 MG tablet Take 1 tablet (10 mg total) by mouth at bedtime. 90 tablet 3   Multiple Vitamin  (MULTIVITAMIN) tablet Take 1 tablet by mouth daily.     No facility-administered medications prior to visit.    Allergies  Allergen Reactions   Bupropion Hives and Rash   XYI:[id number] except as noted in HPI      Objective:     BP (!) 132/92    Pulse 79    Temp 98.2 F (36.8 C) (Temporal)    Ht _0  (1.702 m)    Wt 223 lb 2 oz (101.2 kg)    SpO2 98%    BMI 34.95 kg/m  Wt Readings from Last 3 Encounters:  10/03/21 223 lb 2 oz (101.2 kg)  07/25/21 222 lb 3.2 oz (100.8 kg)  10/11/19 226 lb (102.5 kg)        Gen: WDWN NAD [facility] HEENT: NCAT, conjunctiva not injected, sclera nonicteric NECK:  supple, no thyromegaly, no nodes, no carotid bruits CARDIAC: RRR, S1S2+, no murmur. DP 2+B LUNGS: CTAB. No wheezes ABDOMEN:  BS+, soft, NTND, No HSM, no masses EXT:  no edema MSK: no gross abnormalities.  NEURO: A&O x3.  CN II-XII intact.  PSYCH: normal mood. Good eye contact  Assessment & Plan:   Problem List Items Addressed This Visit       Respiratory   Non-seasonal  allergic rhinitis     Other   Depressed mood   History of gestational diabetes mellitus   Relevant Orders   HgB A1c (Completed)   Other Visit Diagnoses     Metabolic syndrome    -  Primary   Relevant Orders   HgB A1c (Completed)   Class 1 obesity due to excess calories without serious comorbidity with body mass index (BMI) of 34.0 to 34.9 in adult       Relevant Medications   Semaglutide,0.25 or 0.5MG/DOS, (OZEMPIC, 0.25 OR 0.5 MG/DOSE,) 2 MG/1.5ML SOPN     H/o GDM w/FH DM and poss metabolic syndrome-will check A1C.  Try ozempic Obesity-has tried mult methods to lose wt-will try ozempic.  Cont TLC. Allergic rhinitis-stable on meds. Cont Depression/adjusment disorder-stable on meds cont FH breast Ca-on tomoxifen per onc.  Meds ordered this encounter  Medications   Semaglutide,0.25 or 0.5MG/DOS, (OZEMPIC, 0.25 OR 0.5 MG/DOSE,) 2 MG/1.5ML SOPN    Sig: Inject 0.5 mg into the skin once a  week.    Dispense:  1.5 mL    Refill:  0    Wellington Hampshire, MD

## 2021-10-03 NOTE — Patient Instructions (Signed)
It was very nice to see you today!  Let me know how it goes!!!   Check blood pressures    PLEASE NOTE:  If you had any lab tests please let us know if you have not heard back within a few days. You may see your results on MyChart before we have a chance to review them but we will give you a call once they are reviewed by [place]. If we ordered any referrals today, please let us know if you have not heard from their office within the next week.   Please try these tips to maintain a healthy lifestyle:  Eat most of your calories during the day when you are active. Eliminate processed foods including packaged sweets (pies, cakes, cookies), reduce intake of potatoes, white bread, white pasta, and white rice. Look for whole grain options, oat flour or almond flour.  Each meal should contain half fruits/vegetables, one quarter protein, and one quarter carbs (no bigger than a computer mouse).  Cut down on sweet beverages. This includes juice, soda, and sweet tea. Also watch fruit intake, though this is a healthier sweet option, it still contains natural sugar! Limit to 3 servings daily.  Drink at least 1 glass of water with each meal and aim for at least 8 glasses per day  Exercise at least 150 minutes every week.

## 2021-10-04 ENCOUNTER — Other Ambulatory Visit (HOSPITAL_BASED_OUTPATIENT_CLINIC_OR_DEPARTMENT_OTHER): Payer: Self-pay

## 2021-10-05 ENCOUNTER — Other Ambulatory Visit (HOSPITAL_BASED_OUTPATIENT_CLINIC_OR_DEPARTMENT_OTHER): Payer: Self-pay

## 2021-10-09 ENCOUNTER — Other Ambulatory Visit (HOSPITAL_BASED_OUTPATIENT_CLINIC_OR_DEPARTMENT_OTHER): Payer: Self-pay

## 2021-10-10 ENCOUNTER — Other Ambulatory Visit (HOSPITAL_BASED_OUTPATIENT_CLINIC_OR_DEPARTMENT_OTHER): Payer: Self-pay

## 2021-10-11 ENCOUNTER — Other Ambulatory Visit (HOSPITAL_BASED_OUTPATIENT_CLINIC_OR_DEPARTMENT_OTHER): Payer: Self-pay

## 2021-10-18 ENCOUNTER — Other Ambulatory Visit: Payer: Self-pay | Admitting: Family Medicine

## 2021-10-18 ENCOUNTER — Other Ambulatory Visit (HOSPITAL_BASED_OUTPATIENT_CLINIC_OR_DEPARTMENT_OTHER): Payer: Self-pay

## 2021-10-18 DIAGNOSIS — Z6834 Body mass index (BMI) 34.0-34.9, adult: Secondary | ICD-10-CM

## 2021-10-18 DIAGNOSIS — E6609 Other obesity due to excess calories: Secondary | ICD-10-CM

## 2021-10-18 MED ORDER — WEGOVY 0.5 MG/0.5ML ~~LOC~~ SOAJ
0.5000 mg | SUBCUTANEOUS | 1 refills | Status: DC
Start: 2021-10-18 — End: 2022-01-14
  Filled 2021-10-18 – 2021-11-08 (×2): qty 2, 28d supply, fill #0
  Filled 2021-12-18: qty 2, 28d supply, fill #1

## 2021-10-18 NOTE — Progress Notes (Signed)
Ins denied ozempic as not DM   may be able to fill wegovy for wt loss

## 2021-10-19 ENCOUNTER — Telehealth: Payer: Self-pay | Admitting: *Deleted

## 2021-10-19 NOTE — Telephone Encounter (Signed)
Received PA for Wegovy 0.5 mg/0.5 ml form for covermymeds. Tried to submit through covermymeds and received a message:   A previously denied or partially denied PA request has been located for this member. To initiate an appeal or reconsideration please call [phone]. Thank you.  Stephanie Fields (Key: [id number]) Rx #: [id number] [phone] 0.5MG /0.5ML auto-injectors   Form [employer] ePA Form 2017 NCPDP  Called 800 number and spoke with Sonia Baller, completed PA over the phone. Sonia Baller stated that it could take 24 hours to show in their system, could not give a reference number.   Zacarias Pontes, CMA

## 2021-10-22 ENCOUNTER — Other Ambulatory Visit (HOSPITAL_BASED_OUTPATIENT_CLINIC_OR_DEPARTMENT_OTHER): Payer: Self-pay

## 2021-10-24 ENCOUNTER — Other Ambulatory Visit (HOSPITAL_BASED_OUTPATIENT_CLINIC_OR_DEPARTMENT_OTHER): Payer: Self-pay

## 2021-10-25 ENCOUNTER — Other Ambulatory Visit (HOSPITAL_BASED_OUTPATIENT_CLINIC_OR_DEPARTMENT_OTHER): Payer: Self-pay

## 2021-10-26 ENCOUNTER — Other Ambulatory Visit (HOSPITAL_BASED_OUTPATIENT_CLINIC_OR_DEPARTMENT_OTHER): Payer: Self-pay

## 2021-10-29 ENCOUNTER — Encounter (HOSPITAL_BASED_OUTPATIENT_CLINIC_OR_DEPARTMENT_OTHER): Payer: Self-pay | Admitting: Pharmacist

## 2021-10-31 ENCOUNTER — Other Ambulatory Visit (HOSPITAL_BASED_OUTPATIENT_CLINIC_OR_DEPARTMENT_OTHER): Payer: Self-pay

## 2021-11-02 ENCOUNTER — Other Ambulatory Visit (HOSPITAL_BASED_OUTPATIENT_CLINIC_OR_DEPARTMENT_OTHER): Payer: Self-pay

## 2021-11-02 NOTE — Telephone Encounter (Signed)
Fax received from [place] requesting more information concerning PA  for wegovy. Form faxed back on 11/02/2021 with confirmation. Also completed appeal via [employer] on 11/01/2021. Pending decision.  ? ?Stephanie Fields (Key: [id number]) ?Wegovy 0.'5MG'$ /0.5ML auto-injectors ?Sent to Plan ?22 hours ago ?

## 2021-11-06 ENCOUNTER — Other Ambulatory Visit (HOSPITAL_BASED_OUTPATIENT_CLINIC_OR_DEPARTMENT_OTHER): Payer: Self-pay

## 2021-11-06 NOTE — Telephone Encounter (Signed)
PA for Stephanie Fields has been approved on 11/04/2021. Fax received from [place]. Patient is aware. ?

## 2021-11-08 ENCOUNTER — Other Ambulatory Visit (HOSPITAL_BASED_OUTPATIENT_CLINIC_OR_DEPARTMENT_OTHER): Payer: Self-pay

## 2021-11-09 ENCOUNTER — Other Ambulatory Visit (HOSPITAL_BASED_OUTPATIENT_CLINIC_OR_DEPARTMENT_OTHER): Payer: Self-pay

## 2021-11-23 ENCOUNTER — Ambulatory Visit (HOSPITAL_BASED_OUTPATIENT_CLINIC_OR_DEPARTMENT_OTHER)
Admission: RE | Admit: 2021-11-23 | Discharge: 2021-11-23 | Disposition: A | Discharge status: Home / Self Care | Payer: 59 | Source: Ambulatory Visit | Attending: Obstetrics & Gynecology | Admitting: Obstetrics & Gynecology

## 2021-11-23 DIAGNOSIS — Z1231 Encounter for screening mammogram for malignant neoplasm of breast: Secondary | ICD-10-CM | POA: Insufficient documentation

## 2021-11-26 ENCOUNTER — Other Ambulatory Visit (HOSPITAL_BASED_OUTPATIENT_CLINIC_OR_DEPARTMENT_OTHER): Payer: Self-pay

## 2021-12-04 ENCOUNTER — Other Ambulatory Visit (HOSPITAL_BASED_OUTPATIENT_CLINIC_OR_DEPARTMENT_OTHER): Payer: Self-pay

## 2021-12-18 ENCOUNTER — Other Ambulatory Visit (HOSPITAL_BASED_OUTPATIENT_CLINIC_OR_DEPARTMENT_OTHER): Payer: Self-pay

## 2021-12-19 ENCOUNTER — Other Ambulatory Visit (HOSPITAL_BASED_OUTPATIENT_CLINIC_OR_DEPARTMENT_OTHER): Payer: Self-pay

## 2022-01-14 ENCOUNTER — Other Ambulatory Visit (HOSPITAL_BASED_OUTPATIENT_CLINIC_OR_DEPARTMENT_OTHER): Payer: Self-pay

## 2022-01-14 ENCOUNTER — Other Ambulatory Visit: Payer: Self-pay | Admitting: Family Medicine

## 2022-01-14 ENCOUNTER — Encounter (HOSPITAL_BASED_OUTPATIENT_CLINIC_OR_DEPARTMENT_OTHER): Payer: Self-pay

## 2022-01-14 MED ORDER — SEMAGLUTIDE-WEIGHT MANAGEMENT 1 MG/0.5ML ~~LOC~~ SOAJ
1.0000 mg | SUBCUTANEOUS | 0 refills | Status: DC
  Filled 2022-01-14: qty 2, 28d supply, fill #0

## 2022-01-15 ENCOUNTER — Other Ambulatory Visit (HOSPITAL_BASED_OUTPATIENT_CLINIC_OR_DEPARTMENT_OTHER): Payer: Self-pay

## 2022-02-11 ENCOUNTER — Encounter: Payer: Self-pay | Admitting: Family Medicine

## 2022-02-11 ENCOUNTER — Other Ambulatory Visit: Payer: Self-pay | Admitting: *Deleted

## 2022-02-11 ENCOUNTER — Other Ambulatory Visit (HOSPITAL_BASED_OUTPATIENT_CLINIC_OR_DEPARTMENT_OTHER): Payer: Self-pay

## 2022-02-11 MED ORDER — WEGOVY 1.7 MG/0.75ML ~~LOC~~ SOAJ
1.7000 mg | SUBCUTANEOUS | 0 refills | Status: DC
  Filled 2022-02-11: qty 3, 28d supply, fill #0

## 2022-02-14 ENCOUNTER — Other Ambulatory Visit (HOSPITAL_BASED_OUTPATIENT_CLINIC_OR_DEPARTMENT_OTHER): Payer: Self-pay

## 2022-02-14 ENCOUNTER — Encounter (HOSPITAL_BASED_OUTPATIENT_CLINIC_OR_DEPARTMENT_OTHER): Payer: Self-pay

## 2022-02-28 ENCOUNTER — Other Ambulatory Visit: Payer: Self-pay | Admitting: Hematology and Oncology

## 2022-02-28 ENCOUNTER — Other Ambulatory Visit: Payer: Self-pay | Admitting: Family Medicine

## 2022-02-28 ENCOUNTER — Other Ambulatory Visit (HOSPITAL_BASED_OUTPATIENT_CLINIC_OR_DEPARTMENT_OTHER): Payer: Self-pay

## 2022-02-28 MED ORDER — TAMOXIFEN CITRATE 10 MG PO TABS
10.0000 mg | ORAL_TABLET | Freq: Every day | ORAL | 3 refills | Status: DC
  Filled 2022-02-28: qty 90, 90d supply, fill #0
  Filled 2022-06-11: qty 90, 90d supply, fill #1
  Filled 2022-10-02: qty 90, 90d supply, fill #2
  Filled 2023-01-27: qty 90, 90d supply, fill #3

## 2022-03-01 ENCOUNTER — Other Ambulatory Visit (HOSPITAL_BASED_OUTPATIENT_CLINIC_OR_DEPARTMENT_OTHER): Payer: Self-pay

## 2022-03-01 MED ORDER — FLUOXETINE HCL 40 MG PO CAPS
40.0000 mg | ORAL_CAPSULE | Freq: Every day | ORAL | 0 refills | Status: DC
  Filled 2022-03-01: qty 90, 90d supply, fill #0

## 2022-03-20 ENCOUNTER — Other Ambulatory Visit: Payer: Self-pay | Admitting: Family Medicine

## 2022-03-20 ENCOUNTER — Other Ambulatory Visit (HOSPITAL_COMMUNITY): Payer: Self-pay

## 2022-03-20 MED ORDER — WEGOVY 2.4 MG/0.75ML ~~LOC~~ SOAJ
2.4000 mg | SUBCUTANEOUS | 1 refills | Status: DC
  Filled 2022-03-20: qty 3, 28d supply, fill #0
  Filled 2022-04-19: qty 3, 28d supply, fill #1

## 2022-03-21 ENCOUNTER — Other Ambulatory Visit (HOSPITAL_BASED_OUTPATIENT_CLINIC_OR_DEPARTMENT_OTHER): Payer: Self-pay

## 2022-03-22 ENCOUNTER — Other Ambulatory Visit (HOSPITAL_COMMUNITY): Payer: Self-pay

## 2022-03-22 ENCOUNTER — Other Ambulatory Visit (HOSPITAL_BASED_OUTPATIENT_CLINIC_OR_DEPARTMENT_OTHER): Payer: Self-pay

## 2022-04-19 ENCOUNTER — Other Ambulatory Visit (HOSPITAL_BASED_OUTPATIENT_CLINIC_OR_DEPARTMENT_OTHER): Payer: Self-pay

## 2022-05-20 ENCOUNTER — Encounter: Payer: Self-pay | Admitting: *Deleted

## 2022-05-20 ENCOUNTER — Other Ambulatory Visit (HOSPITAL_BASED_OUTPATIENT_CLINIC_OR_DEPARTMENT_OTHER): Payer: Self-pay

## 2022-05-20 ENCOUNTER — Other Ambulatory Visit: Payer: Self-pay | Admitting: Family Medicine

## 2022-05-20 MED ORDER — WEGOVY 2.4 MG/0.75ML ~~LOC~~ SOAJ
2.4000 mg | SUBCUTANEOUS | 1 refills | Status: DC
  Filled 2022-05-20: qty 3, 28d supply, fill #0
  Filled 2022-06-20: qty 3, 28d supply, fill #1

## 2022-06-11 ENCOUNTER — Other Ambulatory Visit: Payer: Self-pay | Admitting: Family Medicine

## 2022-06-11 ENCOUNTER — Other Ambulatory Visit (HOSPITAL_BASED_OUTPATIENT_CLINIC_OR_DEPARTMENT_OTHER): Payer: Self-pay

## 2022-06-11 MED ORDER — FLUOXETINE HCL 40 MG PO CAPS
40.0000 mg | ORAL_CAPSULE | Freq: Every day | ORAL | 1 refills | Status: DC
  Filled 2022-06-11: qty 83, 83d supply, fill #0
  Filled 2022-06-11: qty 7, 7d supply, fill #0
  Filled 2022-10-02: qty 90, 90d supply, fill #1

## 2022-06-20 ENCOUNTER — Other Ambulatory Visit (HOSPITAL_BASED_OUTPATIENT_CLINIC_OR_DEPARTMENT_OTHER): Payer: Self-pay

## 2022-07-17 ENCOUNTER — Other Ambulatory Visit (HOSPITAL_BASED_OUTPATIENT_CLINIC_OR_DEPARTMENT_OTHER): Payer: Self-pay

## 2022-07-17 ENCOUNTER — Other Ambulatory Visit: Payer: Self-pay | Admitting: Family Medicine

## 2022-07-17 MED ORDER — WEGOVY 2.4 MG/0.75ML ~~LOC~~ SOAJ
2.4000 mg | SUBCUTANEOUS | 1 refills | Status: DC
  Filled 2022-07-17: qty 3, 28d supply, fill #0
  Filled 2022-08-15: qty 3, 28d supply, fill #1

## 2022-08-08 ENCOUNTER — Encounter: Payer: Self-pay | Admitting: *Deleted

## 2022-08-15 ENCOUNTER — Other Ambulatory Visit (HOSPITAL_BASED_OUTPATIENT_CLINIC_OR_DEPARTMENT_OTHER): Payer: Self-pay

## 2022-08-16 ENCOUNTER — Other Ambulatory Visit (HOSPITAL_BASED_OUTPATIENT_CLINIC_OR_DEPARTMENT_OTHER): Payer: Self-pay

## 2022-10-02 ENCOUNTER — Other Ambulatory Visit (HOSPITAL_BASED_OUTPATIENT_CLINIC_OR_DEPARTMENT_OTHER): Payer: Self-pay

## 2022-10-02 ENCOUNTER — Other Ambulatory Visit: Payer: Self-pay | Admitting: Family Medicine

## 2022-10-02 ENCOUNTER — Other Ambulatory Visit: Payer: Self-pay

## 2022-10-02 MED ORDER — WEGOVY 2.4 MG/0.75ML ~~LOC~~ SOAJ
2.4000 mg | SUBCUTANEOUS | 0 refills | Status: DC
  Filled 2022-10-02: qty 3, 28d supply, fill #0

## 2022-10-03 ENCOUNTER — Other Ambulatory Visit (HOSPITAL_BASED_OUTPATIENT_CLINIC_OR_DEPARTMENT_OTHER): Payer: Self-pay

## 2022-10-03 ENCOUNTER — Other Ambulatory Visit: Payer: Self-pay | Admitting: Family Medicine

## 2022-10-04 ENCOUNTER — Other Ambulatory Visit (HOSPITAL_BASED_OUTPATIENT_CLINIC_OR_DEPARTMENT_OTHER): Payer: Self-pay

## 2022-10-04 MED ORDER — MONTELUKAST SODIUM 10 MG PO TABS
ORAL_TABLET | ORAL | 3 refills | Status: DC
  Filled 2022-10-04 – 2023-01-27 (×2): qty 90, 90d supply, fill #0
  Filled 2023-04-30 (×2): qty 90, 90d supply, fill #1
  Filled 2023-08-28: qty 90, 90d supply, fill #2

## 2022-10-11 ENCOUNTER — Other Ambulatory Visit (HOSPITAL_BASED_OUTPATIENT_CLINIC_OR_DEPARTMENT_OTHER): Payer: Self-pay

## 2022-11-19 LAB — HEPATIC FUNCTION PANEL
ALT: 17 U/L (ref 7–35)
AST: 14 (ref 13–35)
Alkaline Phosphatase: 65 (ref 25–125)
Bilirubin, Total: 0.5

## 2022-11-19 LAB — BASIC METABOLIC PANEL
BUN: 16 (ref 4–21)
CO2: 18 (ref 13–22)
Chloride: 108 (ref 99–108)
Creatinine: 0.8 (ref 0.5–1.1)
Glucose: 93
Potassium: 4.6 mEq/L (ref 3.5–5.1)
Sodium: 144 (ref 137–147)

## 2022-11-19 LAB — CBC AND DIFFERENTIAL: Platelets: 238 10*3/uL (ref 150–400)

## 2022-11-19 LAB — CBC: RBC: 5.08 (ref 3.87–5.11)

## 2022-11-19 LAB — COMPREHENSIVE METABOLIC PANEL
Albumin: 4.1 (ref 3.5–5.0)
Globulin: 2.2
eGFR: 97

## 2022-11-19 LAB — LIPID PANEL: Cholesterol: 171 (ref 0–200)

## 2022-11-20 LAB — LIPID PANEL
HDL: 38 (ref 35–70)
LDL Cholesterol: 117
Triglycerides: 86 (ref 40–160)

## 2022-11-20 LAB — CBC AND DIFFERENTIAL
HCT: 45 (ref 36–46)
Hemoglobin: 14.7 (ref 12.0–16.0)
Neutrophils Absolute: 5
WBC: 9.5

## 2022-11-20 LAB — TSH: TSH: 2.13 (ref 0.41–5.90)

## 2022-11-20 LAB — COMPREHENSIVE METABOLIC PANEL: Calcium: 9.2 (ref 8.7–10.7)

## 2022-11-20 LAB — HEMOGLOBIN A1C: Hemoglobin A1C: 4.9

## 2023-01-27 ENCOUNTER — Other Ambulatory Visit (HOSPITAL_BASED_OUTPATIENT_CLINIC_OR_DEPARTMENT_OTHER): Payer: Self-pay

## 2023-01-27 ENCOUNTER — Encounter: Payer: Commercial Managed Care - PPO | Admitting: Family Medicine

## 2023-01-28 ENCOUNTER — Other Ambulatory Visit (HOSPITAL_BASED_OUTPATIENT_CLINIC_OR_DEPARTMENT_OTHER): Payer: Self-pay

## 2023-02-06 HISTORY — PX: LASIK: SHX215

## 2023-02-19 ENCOUNTER — Encounter: Payer: Self-pay | Admitting: Family Medicine

## 2023-02-19 ENCOUNTER — Ambulatory Visit (INDEPENDENT_AMBULATORY_CARE_PROVIDER_SITE_OTHER): Payer: 59 | Admitting: Family Medicine

## 2023-02-19 ENCOUNTER — Other Ambulatory Visit (HOSPITAL_BASED_OUTPATIENT_CLINIC_OR_DEPARTMENT_OTHER): Payer: Self-pay

## 2023-02-19 VITALS — BP 130/80 | HR 63 | Temp 98.4°F | Ht 67.0 in | Wt 198.2 lb

## 2023-02-19 DIAGNOSIS — Z1159 Encounter for screening for other viral diseases: Secondary | ICD-10-CM | POA: Diagnosis not present

## 2023-02-19 DIAGNOSIS — Z0000 Encounter for general adult medical examination without abnormal findings: Secondary | ICD-10-CM

## 2023-02-19 MED ORDER — FLUOXETINE HCL 20 MG PO CAPS
20.0000 mg | ORAL_CAPSULE | Freq: Every day | ORAL | 3 refills | Status: DC
  Filled 2023-02-19: qty 90, 90d supply, fill #0
  Filled 2023-04-30 – 2023-05-05 (×2): qty 90, 90d supply, fill #1

## 2023-02-19 NOTE — Progress Notes (Signed)
Phone [phone]   Subjective:   Patient is a 42 y.o. female presenting for annual physical.    Chief Complaint  Patient presents with   Annual Exam    CPE Fasting    Annual-mamm will sch Reginal Lutes was doing great and then ins not cover and now, gaining and always hungry.  Moods-doing ok on prozac every other day will do 20mg  daily.   See problem oriented charting- ROS- ROS: Gen: no fever, chills  Skin: no rash, itching ENT: no ear pain, ear drainage, nasal congestion, rhinorrhea, sinus pressure, sore throat Eyes: no blurry vision, double vision Resp: no cough, wheeze,SOB CV: no CP, palpitations, LE edema,  GI: no heartburn, n/v/d/c, abd pain GU: no dysuria, urgency, frequency, hematuria.  Heavy menses whole life.  Fair on IUD MSK: no joint pain, myalgias, back pain Neuro: no dizziness, headache, weakness, vertigo Psych: no depression, anxiety, insomnia, SI   The following were reviewed and entered/updated in epic: Past Medical History:  Diagnosis Date   Allergy    Asthma    Exercise induced   Borderline hyperlipidemia    Depression    Diabetes mellitus without complication (HCC)    Gestational   Family history of basal cell carcinoma    Family history of breast cancer    Family history of melanoma    Family history of pancreatic cancer    Family history of prostate cancer    Hypertension    Pre eclampsia   Kidney stones    Obesity    Patient Active Problem List   Diagnosis Date Noted   Non-seasonal allergic rhinitis 10/03/2021   At high risk for breast cancer 08/23/2019   Genetic testing 07/21/2019   Family history of prostate cancer    Family history of pancreatic cancer    Family history of melanoma    Family history of basal cell carcinoma    Vitamin D deficiency 05/17/2019   IUD (intrauterine device) in place 05/01/2018   Depressed mood 05/07/2017   Family history of breast cancer 10/25/2015   Personal history of asthma 10/25/2015   Family history  of polyps in the colon 10/25/2015   History of gestational diabetes mellitus 10/25/2015   Past Surgical History:  Procedure Laterality Date   EYE SURGERY  02/06/23   TOOTH EXTRACTION      Family History  Problem Relation Age of Onset   Early death Mother    Alcohol abuse Mother    COPD Mother    Depression Mother    Diabetes Mother    Hyperlipidemia Mother    Hypertension Mother    Mental illness Mother    Hypothyroidism Mother    Anxiety disorder Mother    Obesity Mother    Depression Father    Diabetes Father    Hearing loss Father    Hyperlipidemia Father    Hypertension Father    Colon polyps Father 10   Prostate cancer Father 75       prostate cancer   Cancer Father    Obesity Father    Depression Sister    Depression Brother    Learning disabilities Daughter        Autism   Breast cancer Maternal Aunt 60       negative genetic test   Cancer Maternal Aunt    Heart disease Maternal Grandmother    Breast cancer Maternal Grandmother 4       thyroid ca, breast ca,squamous ca, basal cell ca, melanoma   Basal  cell carcinoma Maternal Grandmother    Melanoma Maternal Grandmother 27   Cancer Maternal Grandmother    Alcohol abuse Maternal Grandfather    Diabetes Maternal Grandfather    Heart attack Maternal Grandfather    Heart disease Maternal Grandfather    Hyperlipidemia Maternal Grandfather    Hypertension Maternal Grandfather    Arthritis Paternal Grandmother    Hearing loss Paternal Grandmother    Heart attack Paternal Grandmother    Heart disease Paternal Grandmother    Hyperlipidemia Paternal Grandmother    Heart disease Paternal Grandfather    Hyperlipidemia Paternal Grandfather    Alzheimer's disease Paternal Grandfather    Depression Sister    Depression Brother    Breast cancer Maternal Aunt 61   Cancer Maternal Aunt    Pancreatic cancer Paternal Uncle 30   Breast cancer Maternal Aunt 47       negative genetic test   Breast cancer Maternal  Great-grandmother 22   Cancer Maternal Aunt    Cancer Paternal Uncle     Medications- reviewed and updated Current Outpatient Medications  Medication Sig Dispense Refill   cholecalciferol (VITAMIN D3) 25 MCG (1000 UNIT) tablet Take 1,000 Units by mouth daily.     FLUoxetine (PROZAC) 40 MG capsule Take 1 capsule (40 mg total) by mouth daily. 90 capsule 1   levonorgestrel (MIRENA) 20 MCG/24HR IUD by Intrauterine route.     montelukast (SINGULAIR) 10 MG tablet Take 1 tablet by mouth once daily 90 tablet 3   tamoxifen (NOLVADEX) 10 MG tablet Take 1 tablet (10 mg total) by mouth daily. 90 tablet 3   No current facility-administered medications for this visit.    Allergies-reviewed and updated Allergies  Allergen Reactions   Bupropion Hives and Rash    Social History   Social History Agricultural consultant at [employer]   Objective  Objective:  BP 130/80   Pulse 63   Temp 98.4 F (36.9 C) (Temporal)   Ht 5\' 7"  (1.702 m)   Wt 198 lb 4 oz (89.9 kg)   SpO2 97%   BMI 31.05 kg/m  Physical Exam  Gen: WDWN NAD HEENT: NCAT, conjunctiva not injected, sclera nonicteric TM WNL B, OP moist, no exudates  NECK:  supple, no thyromegaly, no nodes, no carotid bruits CARDIAC: RRR, S1S2+, no murmur. DP 2+B LUNGS: CTAB. No wheezes ABDOMEN:  BS+, soft, NTND, No HSM, no masses EXT:  no edema MSK: no gross abnormalities. MS 5/5 all 4 NEURO: A&O x3.  CN II-XII intact.  PSYCH: normal mood. Good eye contact     Assessment and Plan   Health Maintenance counseling: 1. Anticipatory guidance: Patient counseled regarding regular dental exams q6 months, eye exams,  avoiding smoking and second hand smoke, limiting alcohol to 1 beverage per day, no illicit drugs.   2. Risk factor reduction:  Advised patient of need for regular exercise and diet rich and fruits and vegetables to reduce risk of heart attack and stroke. Exercise- +.  Wt Readings from Last 3 Encounters:  02/19/23 198 lb 4 oz (89.9  kg)  10/03/21 223 lb 2 oz (101.2 kg)  07/25/21 222 lb 3.2 oz (100.8 kg)   3. Immunizations/screenings/ancillary studies Immunization History  Administered Date(s) Administered   Influenza,inj,Quad PF,6+ Mos 05/14/2019   Influenza-Unspecified 05/22/2016, 05/15/2017, 06/09/2021   PFIZER(Purple Top)SARS-COV-2 Vaccination 08/15/2019   Td 11/05/2019   Tdap 10/24/2012   Health Maintenance Due  Topic Date Due   Hepatitis C Screening  Never done  4. Cervical cancer screening- utd 5. Breast cancer screening-  mammogram pt sch 6. Colon cancer screening - n/a 7. Skin cancer screening- advised regular sunscreen use. Denies worrisome, changing, or new skin lesions.  8. Birth control/STD check- IUD 9. Osteoporosis screening- n/a 10. Smoking associated screening - non smoker  Wellness examination  Screening for viral disease   [facility] guidance.  RHM UTD.  Reviewed labs from [facility].  HLD 38, LDL 117  Recommended follow up: No follow-ups on file.  Lab/Order associations:n/a fasting  Angelena Sole, MD

## 2023-02-19 NOTE — Patient Instructions (Signed)

## 2023-04-04 ENCOUNTER — Other Ambulatory Visit (HOSPITAL_COMMUNITY): Payer: Self-pay

## 2023-04-04 ENCOUNTER — Encounter: Payer: Self-pay | Admitting: Family Medicine

## 2023-04-04 ENCOUNTER — Other Ambulatory Visit: Payer: Self-pay | Admitting: Family Medicine

## 2023-04-04 MED ORDER — ZEPBOUND 2.5 MG/0.5ML ~~LOC~~ SOAJ
2.5000 mg | SUBCUTANEOUS | 0 refills | Status: DC
  Filled 2023-04-04 – 2023-04-07 (×2): qty 2, 28d supply, fill #0

## 2023-04-04 NOTE — Telephone Encounter (Signed)
Please advise 

## 2023-04-07 ENCOUNTER — Other Ambulatory Visit (HOSPITAL_COMMUNITY): Payer: Self-pay

## 2023-04-07 ENCOUNTER — Other Ambulatory Visit (HOSPITAL_BASED_OUTPATIENT_CLINIC_OR_DEPARTMENT_OTHER): Payer: Self-pay

## 2023-04-22 ENCOUNTER — Other Ambulatory Visit (HOSPITAL_COMMUNITY): Payer: Self-pay

## 2023-04-30 ENCOUNTER — Other Ambulatory Visit: Payer: Self-pay | Admitting: Hematology and Oncology

## 2023-04-30 ENCOUNTER — Other Ambulatory Visit (HOSPITAL_BASED_OUTPATIENT_CLINIC_OR_DEPARTMENT_OTHER): Payer: Self-pay

## 2023-04-30 ENCOUNTER — Encounter: Payer: Self-pay | Admitting: Family Medicine

## 2023-04-30 ENCOUNTER — Other Ambulatory Visit: Payer: Self-pay | Admitting: *Deleted

## 2023-04-30 MED ORDER — ZEPBOUND 5 MG/0.5ML ~~LOC~~ SOAJ
5.0000 mg | SUBCUTANEOUS | 0 refills | Status: DC
  Filled 2023-04-30: qty 2, 28d supply, fill #0

## 2023-05-01 ENCOUNTER — Other Ambulatory Visit (HOSPITAL_BASED_OUTPATIENT_CLINIC_OR_DEPARTMENT_OTHER): Payer: Self-pay

## 2023-05-01 ENCOUNTER — Other Ambulatory Visit: Payer: Self-pay

## 2023-05-01 MED ORDER — TAMOXIFEN CITRATE 10 MG PO TABS
10.0000 mg | ORAL_TABLET | Freq: Every day | ORAL | 3 refills | Status: AC
  Filled 2023-05-01: qty 90, 90d supply, fill #0
  Filled 2023-08-28: qty 90, 90d supply, fill #1
  Filled 2023-12-22: qty 90, 90d supply, fill #2
  Filled 2024-03-24: qty 90, 90d supply, fill #3

## 2023-05-07 ENCOUNTER — Other Ambulatory Visit (HOSPITAL_BASED_OUTPATIENT_CLINIC_OR_DEPARTMENT_OTHER): Payer: Self-pay

## 2023-06-02 ENCOUNTER — Encounter: Payer: Self-pay | Admitting: Family Medicine

## 2023-06-02 ENCOUNTER — Other Ambulatory Visit: Payer: Self-pay | Admitting: *Deleted

## 2023-06-02 ENCOUNTER — Other Ambulatory Visit (HOSPITAL_BASED_OUTPATIENT_CLINIC_OR_DEPARTMENT_OTHER): Payer: Self-pay

## 2023-06-02 MED ORDER — ZEPBOUND 7.5 MG/0.5ML ~~LOC~~ SOAJ
7.5000 mg | SUBCUTANEOUS | 0 refills | Status: DC
  Filled 2023-06-02: qty 2, 28d supply, fill #0

## 2023-06-07 IMAGING — MG MM DIGITAL SCREENING BILAT W/ TOMO AND CAD
6 of 10 series · 6 of 30 positions shown · non-contrast
Comparison: Previous exam(s).

CLINICAL DATA: Screening.

EXAM:
DIGITAL SCREENING BILATERAL MAMMOGRAM WITH TOMOSYNTHESIS AND CAD
TECHNIQUE: Bilateral screening digital craniocaudal and mediolateral oblique
mammograms were obtained. Bilateral screening digital breast
tomosynthesis was performed. The images were evaluated with
computer-aided detection.

[R CC synth-2D]
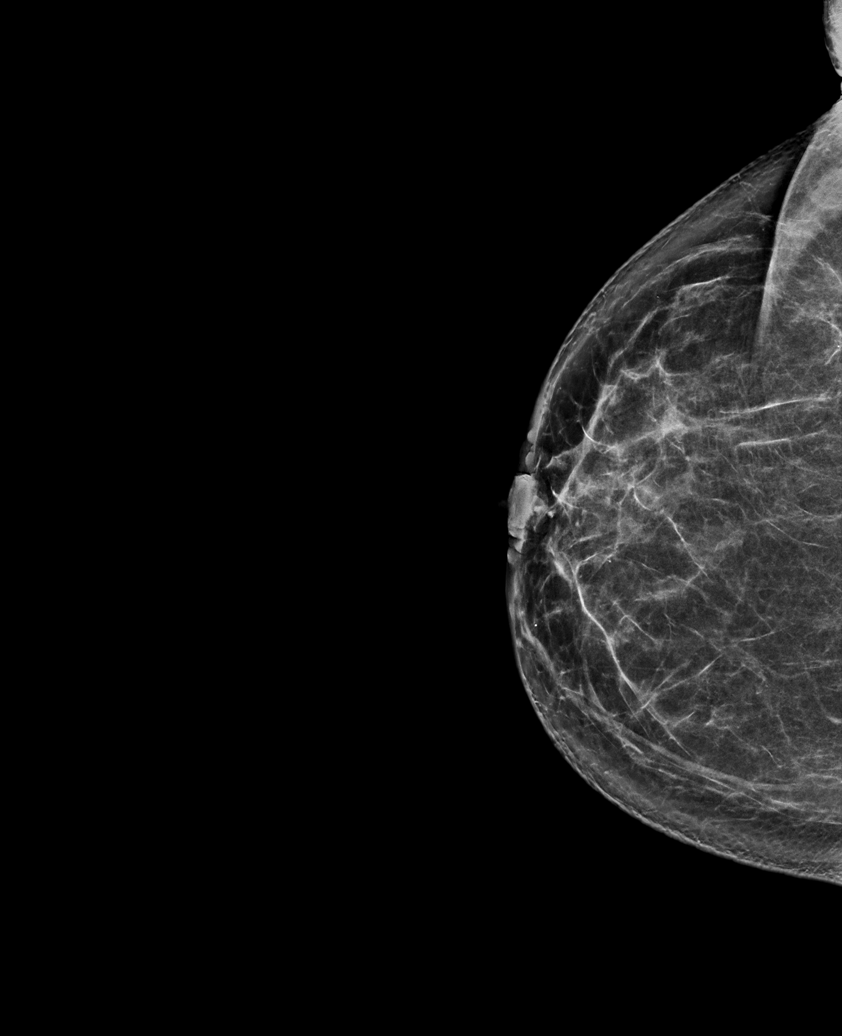

[L MLO synth-2D]
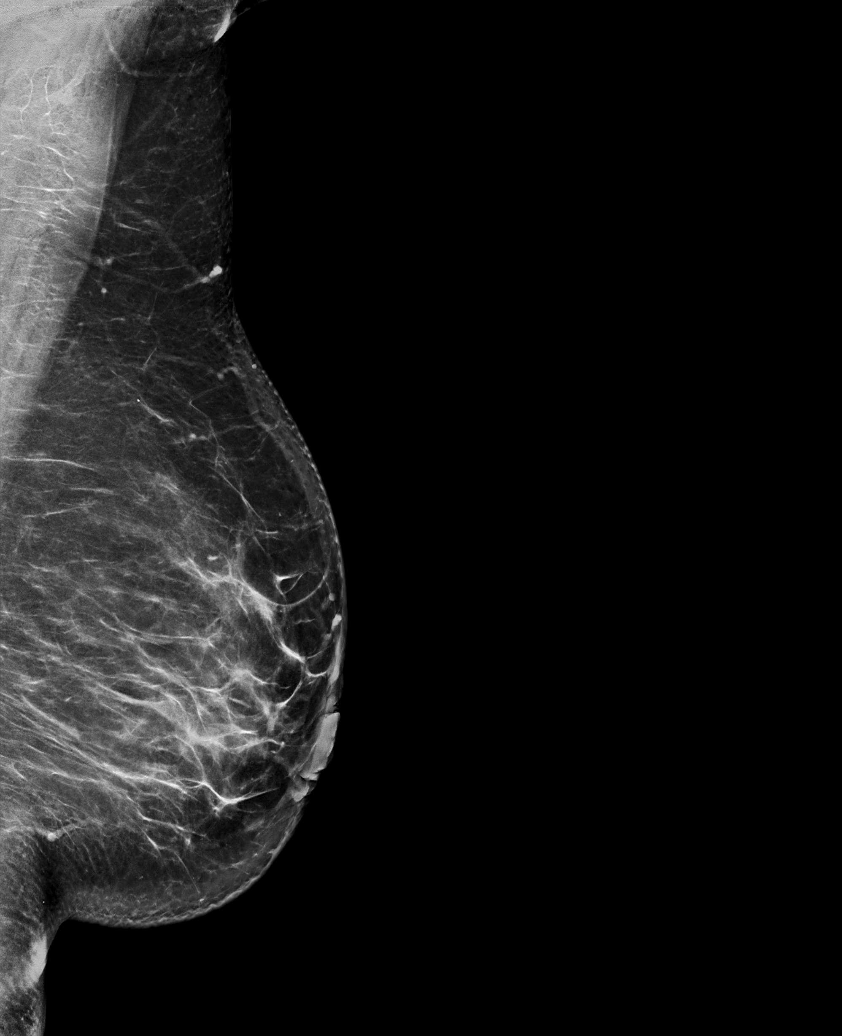

[R MLO synth-2D (1 of 2)]
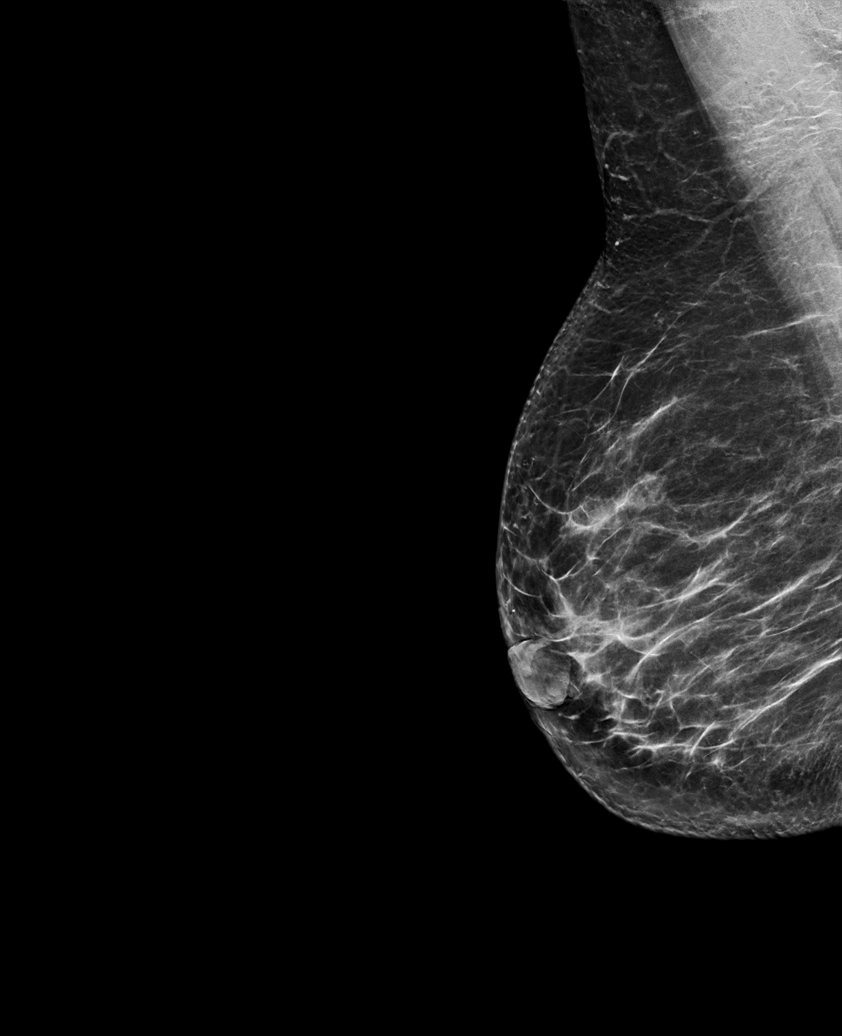

[R MLO synth-2D (2 of 2)]
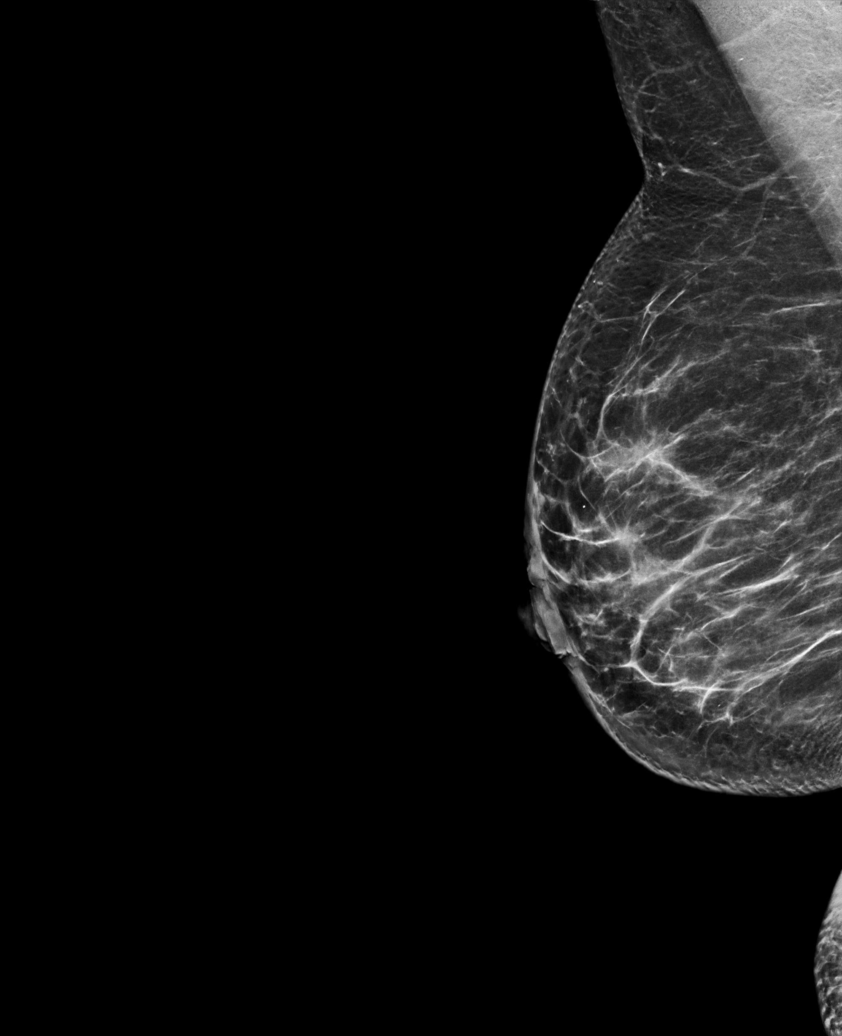

[L CC synth-2D]
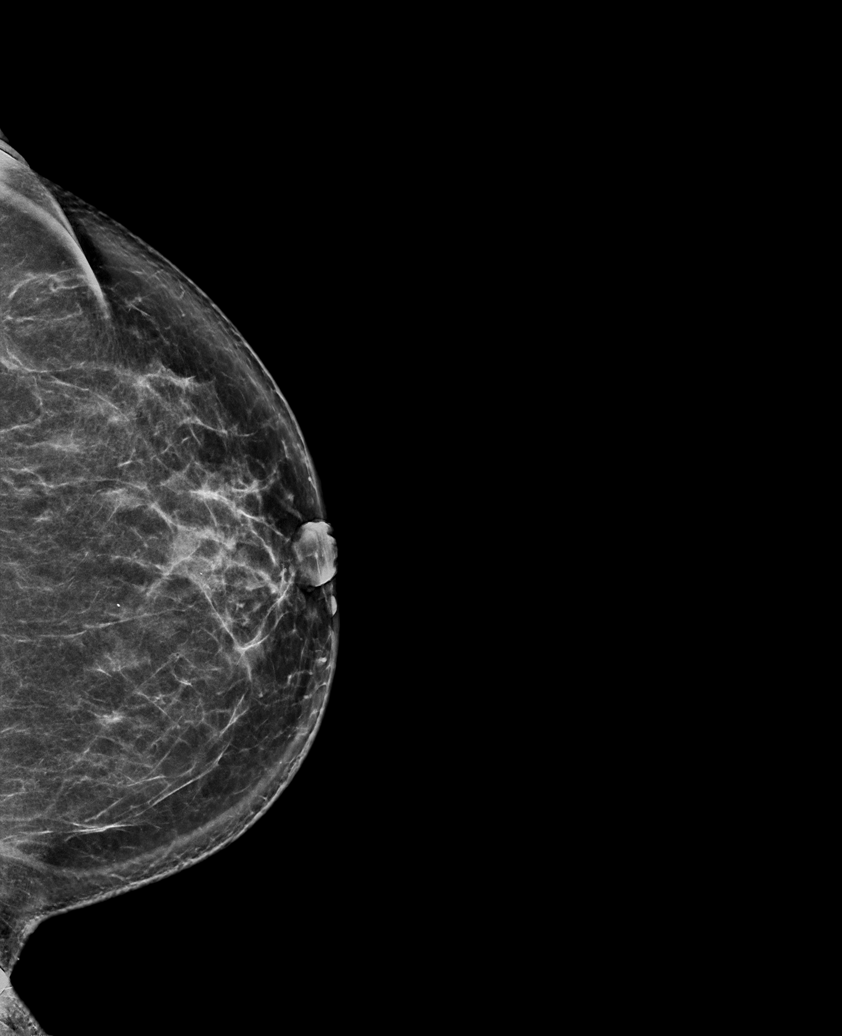

[L CC tomo · tomo slice 40/79.0]
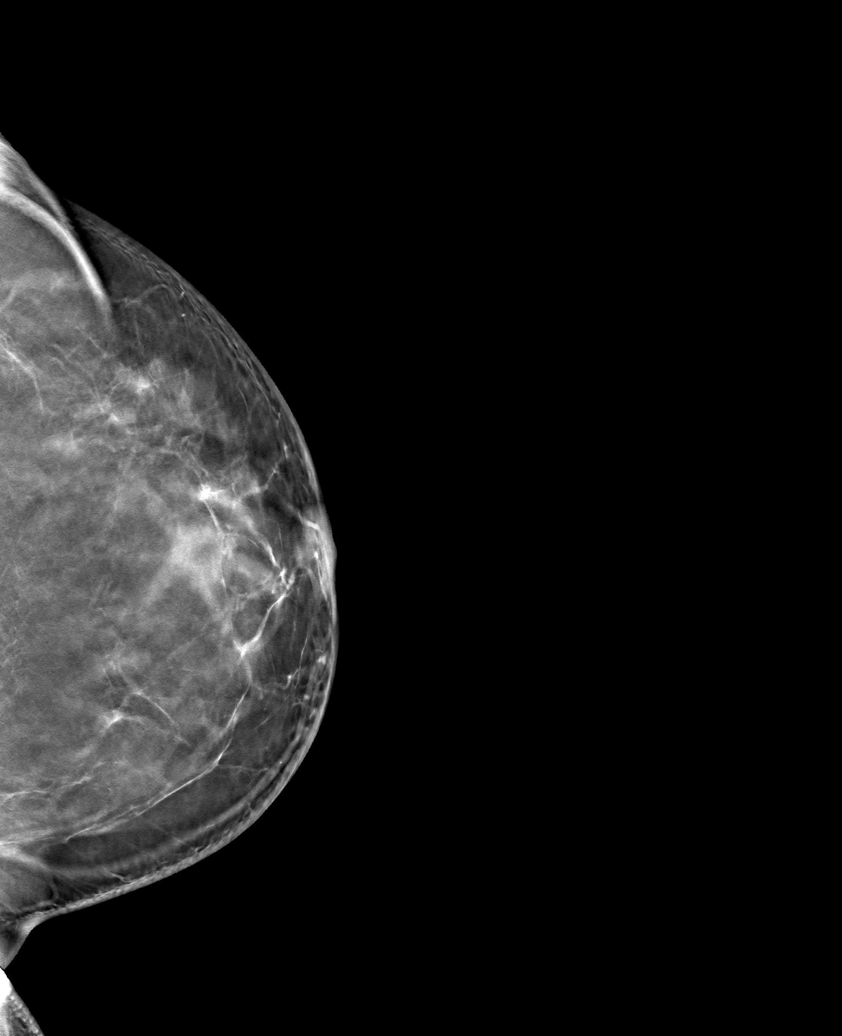

[6 of 30 positions shown; findings below may reference images not displayed]

ACR Breast Density Category b: There are scattered areas of
fibroglandular density.
FINDINGS: There are no findings suspicious for malignancy.
IMPRESSION: No mammographic evidence of malignancy. A result letter of this
screening mammogram will be mailed directly to the patient.

RECOMMENDATION:
Screening mammogram in one year. (Code:51-O-LD2)

BI-RADS CATEGORY  1: Negative.

## 2023-06-23 ENCOUNTER — Other Ambulatory Visit: Payer: Self-pay

## 2023-06-23 ENCOUNTER — Other Ambulatory Visit (HOSPITAL_BASED_OUTPATIENT_CLINIC_OR_DEPARTMENT_OTHER): Payer: Self-pay

## 2023-06-23 MED ORDER — LISDEXAMFETAMINE DIMESYLATE 20 MG PO CHEW
20.0000 mg | CHEWABLE_TABLET | Freq: Every day | ORAL | 0 refills | Status: DC
  Filled 2023-06-23: qty 30, 30d supply, fill #0

## 2023-07-01 ENCOUNTER — Encounter: Payer: Self-pay | Admitting: Family Medicine

## 2023-07-02 ENCOUNTER — Other Ambulatory Visit: Payer: Self-pay | Admitting: *Deleted

## 2023-07-02 ENCOUNTER — Other Ambulatory Visit (HOSPITAL_BASED_OUTPATIENT_CLINIC_OR_DEPARTMENT_OTHER): Payer: Self-pay

## 2023-07-02 MED ORDER — ZEPBOUND 10 MG/0.5ML ~~LOC~~ SOAJ
10.0000 mg | SUBCUTANEOUS | 4 refills | Status: DC
  Filled 2023-07-02: qty 2, 28d supply, fill #0

## 2023-07-11 ENCOUNTER — Other Ambulatory Visit (HOSPITAL_BASED_OUTPATIENT_CLINIC_OR_DEPARTMENT_OTHER): Payer: Self-pay

## 2023-07-11 MED ORDER — LISDEXAMFETAMINE DIMESYLATE 50 MG PO CAPS
50.0000 mg | ORAL_CAPSULE | Freq: Every morning | ORAL | 0 refills | Status: DC
  Filled 2023-07-11: qty 30, 30d supply, fill #0

## 2023-07-12 ENCOUNTER — Other Ambulatory Visit (HOSPITAL_BASED_OUTPATIENT_CLINIC_OR_DEPARTMENT_OTHER): Payer: Self-pay

## 2023-08-28 ENCOUNTER — Other Ambulatory Visit: Payer: Self-pay | Admitting: *Deleted

## 2023-08-28 ENCOUNTER — Other Ambulatory Visit: Payer: Self-pay

## 2023-08-28 ENCOUNTER — Encounter: Payer: Self-pay | Admitting: Family Medicine

## 2023-08-28 ENCOUNTER — Other Ambulatory Visit (HOSPITAL_BASED_OUTPATIENT_CLINIC_OR_DEPARTMENT_OTHER): Payer: Self-pay

## 2023-08-28 MED ORDER — ZEPBOUND 12.5 MG/0.5ML ~~LOC~~ SOAJ
12.5000 mg | SUBCUTANEOUS | 0 refills | Status: DC
  Filled 2023-08-28: qty 2, 28d supply, fill #0

## 2023-08-29 ENCOUNTER — Other Ambulatory Visit (HOSPITAL_BASED_OUTPATIENT_CLINIC_OR_DEPARTMENT_OTHER): Payer: Self-pay

## 2023-08-29 ENCOUNTER — Other Ambulatory Visit: Payer: Self-pay

## 2023-09-01 ENCOUNTER — Other Ambulatory Visit (HOSPITAL_BASED_OUTPATIENT_CLINIC_OR_DEPARTMENT_OTHER): Payer: Self-pay

## 2023-09-01 ENCOUNTER — Other Ambulatory Visit (HOSPITAL_BASED_OUTPATIENT_CLINIC_OR_DEPARTMENT_OTHER): Payer: Self-pay | Admitting: Obstetrics & Gynecology

## 2023-09-01 DIAGNOSIS — Z1231 Encounter for screening mammogram for malignant neoplasm of breast: Secondary | ICD-10-CM

## 2023-09-01 MED ORDER — LISDEXAMFETAMINE DIMESYLATE 50 MG PO CAPS
50.0000 mg | ORAL_CAPSULE | Freq: Every morning | ORAL | 0 refills | Status: DC
  Filled 2023-09-01: qty 30, 30d supply, fill #0

## 2023-09-15 ENCOUNTER — Other Ambulatory Visit: Payer: Self-pay

## 2023-09-15 ENCOUNTER — Encounter (HOSPITAL_BASED_OUTPATIENT_CLINIC_OR_DEPARTMENT_OTHER): Payer: Self-pay | Admitting: Emergency Medicine

## 2023-09-15 ENCOUNTER — Emergency Department (HOSPITAL_BASED_OUTPATIENT_CLINIC_OR_DEPARTMENT_OTHER): Payer: 59

## 2023-09-15 ENCOUNTER — Observation Stay (HOSPITAL_BASED_OUTPATIENT_CLINIC_OR_DEPARTMENT_OTHER)
Admission: EM | Admit: 2023-09-15 | Discharge: 2023-09-16 | Disposition: A | Discharge status: Home / Self Care | Payer: 59 | Attending: General Surgery | Admitting: General Surgery

## 2023-09-15 ENCOUNTER — Emergency Department (HOSPITAL_COMMUNITY): Payer: 59

## 2023-09-15 DIAGNOSIS — J45909 Unspecified asthma, uncomplicated: Secondary | ICD-10-CM | POA: Insufficient documentation

## 2023-09-15 DIAGNOSIS — E876 Hypokalemia: Secondary | ICD-10-CM | POA: Insufficient documentation

## 2023-09-15 DIAGNOSIS — K8001 Calculus of gallbladder with acute cholecystitis with obstruction: Principal | ICD-10-CM | POA: Insufficient documentation

## 2023-09-15 DIAGNOSIS — R1011 Right upper quadrant pain: Secondary | ICD-10-CM | POA: Diagnosis not present

## 2023-09-15 DIAGNOSIS — R1013 Epigastric pain: Secondary | ICD-10-CM | POA: Diagnosis not present

## 2023-09-15 DIAGNOSIS — K810 Acute cholecystitis: Principal | ICD-10-CM | POA: Diagnosis present

## 2023-09-15 DIAGNOSIS — K8020 Calculus of gallbladder without cholecystitis without obstruction: Secondary | ICD-10-CM | POA: Diagnosis not present

## 2023-09-15 DIAGNOSIS — R748 Abnormal levels of other serum enzymes: Secondary | ICD-10-CM | POA: Diagnosis not present

## 2023-09-15 DIAGNOSIS — D259 Leiomyoma of uterus, unspecified: Secondary | ICD-10-CM | POA: Diagnosis not present

## 2023-09-15 DIAGNOSIS — E119 Type 2 diabetes mellitus without complications: Secondary | ICD-10-CM | POA: Diagnosis not present

## 2023-09-15 DIAGNOSIS — I10 Essential (primary) hypertension: Secondary | ICD-10-CM | POA: Diagnosis not present

## 2023-09-15 DIAGNOSIS — Z79899 Other long term (current) drug therapy: Secondary | ICD-10-CM | POA: Insufficient documentation

## 2023-09-15 HISTORY — PX: IR PERC CHOLECYSTOSTOMY: IMG2326

## 2023-09-15 LAB — COMPREHENSIVE METABOLIC PANEL
ALT: 22 U/L (ref 0–44)
AST: 30 U/L (ref 15–41)
Albumin: 4.3 g/dL (ref 3.5–5.0)
Alkaline Phosphatase: 57 U/L (ref 38–126)
Anion gap: 12 (ref 5–15)
BUN: 11 mg/dL (ref 6–20)
CO2: 26 mmol/L (ref 22–32)
Calcium: 9.3 mg/dL (ref 8.9–10.3)
Chloride: 105 mmol/L (ref 98–111)
Creatinine, Ser: 0.79 mg/dL (ref 0.44–1.00)
GFR, Estimated: >60 mL/min (ref >60)
Glucose, Bld: 116 mg/dL — ABNORMAL HIGH (ref 70–99)
Potassium: 3.1 mmol/L — ABNORMAL LOW (ref 3.5–5.1)
Sodium: 143 mmol/L (ref 135–145)
Total Bilirubin: 0.5 mg/dL (ref 0.0–1.2)
Total Protein: 7 g/dL (ref 6.5–8.1)

## 2023-09-15 LAB — CBC
HCT: 43.1 % (ref 36.0–46.0)
Hemoglobin: 14.8 g/dL (ref 12.0–15.0)
MCH: 28.3 pg (ref 26.0–34.0)
MCHC: 34.3 g/dL (ref 30.0–36.0)
MCV: 82.4 fL (ref 80.0–100.0)
Platelets: 258 10*3/uL (ref 150–400)
RBC: 5.23 MIL/uL — ABNORMAL HIGH (ref 3.87–5.11)
RDW: 12.3 % (ref 11.5–15.5)
WBC: 11.7 10*3/uL — ABNORMAL HIGH (ref 4.0–10.5)
nRBC: 0 % (ref 0.0–0.2)

## 2023-09-15 LAB — URINALYSIS, ROUTINE W REFLEX MICROSCOPIC
Bilirubin Urine: NEGATIVE
Glucose, UA: NEGATIVE mg/dL
Ketones, ur: NEGATIVE mg/dL
Leukocytes,Ua: NEGATIVE
Nitrite: NEGATIVE
Protein, ur: NEGATIVE mg/dL
Specific Gravity, Urine: 1.01 (ref 1.005–1.030)
pH: 8 (ref 5.0–8.0)

## 2023-09-15 LAB — HIV ANTIBODY (ROUTINE TESTING W REFLEX): HIV Screen 4th Generation wRfx: NONREACTIVE

## 2023-09-15 LAB — TROPONIN I (HIGH SENSITIVITY): Troponin I (High Sensitivity): 3 ng/L (ref <18)

## 2023-09-15 LAB — PROTIME-INR
INR: 1.1 (ref 0.8–1.2)
Prothrombin Time: 14.6 s (ref 11.4–15.2)

## 2023-09-15 LAB — LIPASE, BLOOD: Lipase: 55 U/L — ABNORMAL HIGH (ref 11–51)

## 2023-09-15 LAB — PREGNANCY, URINE: Preg Test, Ur: NEGATIVE

## 2023-09-15 MED ORDER — OXYCODONE HCL 5 MG PO TABS
5.0000 mg | ORAL_TABLET | ORAL | Status: DC | PRN
  Administered 2023-09-15: 10 mg via ORAL
  Administered 2023-09-15: 5 mg via ORAL
  Filled 2023-09-15: qty 2
  Filled 2023-09-15: qty 1

## 2023-09-15 MED ORDER — HYDROMORPHONE HCL 1 MG/ML IJ SOLN
INTRAMUSCULAR | Status: AC
  Filled 2023-09-15: qty 1

## 2023-09-15 MED ORDER — ACETAMINOPHEN 500 MG PO TABS
1000.0000 mg | ORAL_TABLET | Freq: Four times a day (QID) | ORAL | Status: DC
  Administered 2023-09-15 – 2023-09-16 (×5): 1000 mg via ORAL
  Filled 2023-09-15 (×5): qty 2

## 2023-09-15 MED ORDER — SODIUM CHLORIDE 0.9 % IV SOLN: INTRAVENOUS | Status: AC | PRN

## 2023-09-15 MED ORDER — SODIUM CHLORIDE 0.9 % IV SOLN
1.0000 g | Freq: Once | INTRAVENOUS | Status: AC
Start: 2023-09-15 — End: 2023-09-15
  Administered 2023-09-15: 1 g via INTRAVENOUS
  Filled 2023-09-15: qty 10

## 2023-09-15 MED ORDER — SODIUM CHLORIDE 0.9 % IV SOLN
2.0000 g | INTRAVENOUS | Status: DC
  Administered 2023-09-16: 2 g via INTRAVENOUS
  Filled 2023-09-15: qty 20

## 2023-09-15 MED ORDER — MORPHINE SULFATE (PF) 2 MG/ML IV SOLN
1.0000 mg | INTRAVENOUS | Status: DC | PRN
Start: 2023-09-15 — End: 2023-09-16

## 2023-09-15 MED ORDER — LIDOCAINE HCL 1 % IJ SOLN
INTRAMUSCULAR | Status: AC
Start: 2023-09-15 — End: ?
  Filled 2023-09-15: qty 20

## 2023-09-15 MED ORDER — SODIUM CHLORIDE 0.9 % IV SOLN: INTRAVENOUS | Status: DC

## 2023-09-15 MED ORDER — METRONIDAZOLE 500 MG/100ML IV SOLN
500.0000 mg | Freq: Once | INTRAVENOUS | Status: AC
  Administered 2023-09-15: 500 mg via INTRAVENOUS
  Filled 2023-09-15: qty 100

## 2023-09-15 MED ORDER — FENTANYL CITRATE (PF) 100 MCG/2ML IJ SOLN
INTRAMUSCULAR | Status: AC | PRN
  Administered 2023-09-15 (×4): 50 ug via INTRAVENOUS

## 2023-09-15 MED ORDER — IOHEXOL 300 MG/ML  SOLN
100.0000 mL | Freq: Once | INTRAMUSCULAR | Status: AC | PRN
  Administered 2023-09-15: 100 mL via INTRAVENOUS

## 2023-09-15 MED ORDER — MIDAZOLAM HCL 2 MG/2ML IJ SOLN
INTRAMUSCULAR | Status: AC
Start: 2023-09-15 — End: ?
  Filled 2023-09-15: qty 2

## 2023-09-15 MED ORDER — LIDOCAINE HCL (PF) 1 % IJ SOLN
30.0000 mL | Freq: Once | INTRAMUSCULAR | Status: AC
  Administered 2023-09-15: 12 mL via INTRADERMAL

## 2023-09-15 MED ORDER — SODIUM CHLORIDE 0.9 % IV BOLUS
1000.0000 mL | Freq: Once | INTRAVENOUS | Status: AC
  Administered 2023-09-15: 1000 mL via INTRAVENOUS

## 2023-09-15 MED ORDER — SIMETHICONE 80 MG PO CHEW
40.0000 mg | CHEWABLE_TABLET | Freq: Four times a day (QID) | ORAL | Status: DC | PRN
  Filled 2023-09-15: qty 1

## 2023-09-15 MED ORDER — FENTANYL CITRATE (PF) 100 MCG/2ML IJ SOLN
INTRAMUSCULAR | Status: AC
  Filled 2023-09-15: qty 2

## 2023-09-15 MED ORDER — ONDANSETRON HCL 4 MG/2ML IJ SOLN: 4.0000 mg | Freq: Four times a day (QID) | INTRAMUSCULAR | Status: DC | PRN

## 2023-09-15 MED ORDER — ENOXAPARIN SODIUM 40 MG/0.4ML IJ SOSY
40.0000 mg | PREFILLED_SYRINGE | INTRAMUSCULAR | Status: DC
  Administered 2023-09-16: 40 mg via SUBCUTANEOUS
  Filled 2023-09-15: qty 0.4

## 2023-09-15 MED ORDER — KCL IN DEXTROSE-NACL 20-5-0.45 MEQ/L-%-% IV SOLN
INTRAVENOUS | Status: AC
  Filled 2023-09-15 (×2): qty 1000

## 2023-09-15 MED ORDER — IOHEXOL 300 MG/ML  SOLN
50.0000 mL | Freq: Once | INTRAMUSCULAR | Status: AC | PRN
  Administered 2023-09-15: 10 mL

## 2023-09-15 MED ORDER — IBUPROFEN 600 MG PO TABS
600.0000 mg | ORAL_TABLET | Freq: Three times a day (TID) | ORAL | Status: DC | PRN
  Administered 2023-09-15: 600 mg via ORAL
  Filled 2023-09-15: qty 1

## 2023-09-15 MED ORDER — MELATONIN 3 MG PO TABS: 3.0000 mg | ORAL_TABLET | Freq: Every evening | ORAL | Status: DC | PRN

## 2023-09-15 MED ORDER — HYDROMORPHONE HCL 1 MG/ML IJ SOLN
INTRAMUSCULAR | Status: AC | PRN
  Administered 2023-09-15 (×2): .5 mg via INTRAVENOUS

## 2023-09-15 MED ORDER — MIDAZOLAM HCL 2 MG/2ML IJ SOLN
INTRAMUSCULAR | Status: AC | PRN
  Administered 2023-09-15 (×2): 1 mg via INTRAVENOUS

## 2023-09-15 MED ORDER — HYDRALAZINE HCL 20 MG/ML IJ SOLN: 10.0000 mg | INTRAMUSCULAR | Status: DC | PRN

## 2023-09-15 MED ORDER — DIPHENHYDRAMINE HCL 25 MG PO CAPS: 25.0000 mg | ORAL_CAPSULE | Freq: Four times a day (QID) | ORAL | Status: DC | PRN

## 2023-09-15 MED ORDER — CEFTRIAXONE SODIUM 1 G IJ SOLR
1.0000 g | Freq: Once | INTRAMUSCULAR | Status: AC
  Administered 2023-09-15: 1 g via INTRAVENOUS
  Filled 2023-09-15: qty 10

## 2023-09-15 MED ORDER — POLYETHYLENE GLYCOL 3350 17 G PO PACK: 17.0000 g | PACK | Freq: Every day | ORAL | Status: DC | PRN

## 2023-09-15 MED ORDER — DIPHENHYDRAMINE HCL 50 MG/ML IJ SOLN: 25.0000 mg | Freq: Four times a day (QID) | INTRAMUSCULAR | Status: DC | PRN

## 2023-09-15 MED ORDER — ONDANSETRON 4 MG PO TBDP: 4.0000 mg | ORAL_TABLET | Freq: Four times a day (QID) | ORAL | Status: DC | PRN

## 2023-09-15 NOTE — ED Notes (Signed)
Pt to IR for procedure, well appearing upon transport.

## 2023-09-15 NOTE — ED Provider Notes (Signed)
[place] EMERGENCY DEPARTMENT AT [facility] Provider Note   CSN: [id number] Arrival date & time: 09/15/23  0004     History  Chief Complaint  Patient presents with   Abdominal Pain   Emesis    Stephanie Fields is a 43 y.o. female.  Patient is a 43 year old female presenting with complaints of epigastric and right upper quadrant pain.  This started several hours prior to presentation and shortly after eating dinner.  She described a burning pain to the epigastrium that she thought was reflux.  She took multiple doses of Tums with little relief.  The pain became worse and she had episodes of nausea and vomiting.  Pain is worse with deep breathing, movement, and palpation.  No fevers or chills.  She denies prior abdominal surgeries.  The history is provided by the patient.       Home Medications Prior to Admission medications   Medication Sig Start Date End Date Taking? Authorizing Provider  cholecalciferol (VITAMIN D3) 25 MCG (1000 UNIT) tablet Take 1,000 Units by mouth daily.    [provider]  FLUoxetine (PROZAC) 20 MG capsule Take 1 capsule (20 mg total) by mouth daily. 02/19/23   Jeani Sow, MD  levonorgestrel [facility]) 20 MCG/24HR IUD by Intrauterine route.    [provider]  lisdexamfetamine (VYVANSE) 50 MG capsule Take 1 capsule (50 mg total) by mouth every morning. 07/11/23     lisdexamfetamine (VYVANSE) 50 MG capsule Take 1 capsule (50 mg total) by mouth in the morning. 09/01/23     Lisdexamfetamine Dimesylate 20 MG CHEW Chew 1 tablet (20 mg total) by mouth daily in the morning and adjust as per directions 06/22/23     montelukast (SINGULAIR) 10 MG tablet Take 1 tablet by mouth once daily 10/04/22   Jeani Sow, MD  tamoxifen (NOLVADEX) 10 MG tablet Take 1 tablet (10 mg total) by mouth daily. 05/01/23   Serena Croissant, MD  tirzepatide (ZEPBOUND) 12.5 MG/0.5ML Pen Inject 12.5 mg into the skin once a week. 08/28/23   Jeani Sow, MD       Allergies    Bupropion    Review of Systems   Review of Systems  All other systems reviewed and are negative.   Physical Exam Updated Vital Signs BP (!) 143/74   Pulse 69   Temp 99.7 F (37.6 C) (Oral)   Resp (!) 22   Ht 5\' 7"  (1.702 m)   Wt 88.5 kg   SpO2 100%   BMI 30.54 kg/m  Physical Exam Vitals and nursing note reviewed.  Constitutional:      General: She is not in acute distress.    Appearance: She is well-developed. She is not diaphoretic.  HENT:     Head: Normocephalic and atraumatic.  Cardiovascular:     Rate and Rhythm: Normal rate and regular rhythm.     Heart sounds: No murmur heard.    No friction rub. No gallop.  Pulmonary:     Effort: Pulmonary effort is normal. No respiratory distress.     Breath sounds: Normal breath sounds. No wheezing.  Abdominal:     General: Bowel sounds are normal. There is no distension.     Palpations: Abdomen is soft.     Tenderness: There is abdominal tenderness in the right upper quadrant and epigastric area. There is no right CVA tenderness, left CVA tenderness, guarding or rebound.  Musculoskeletal:        General: Normal range of motion.  Cervical back: Normal range of motion and neck supple.  Skin:    General: Skin is warm and dry.  Neurological:     General: No focal deficit present.     Mental Status: She is alert and oriented to person, place, and time.     ED Results / Procedures / Treatments   Labs (all labs ordered are listed, but only abnormal results are displayed) Labs Reviewed  CBC - Abnormal; Notable for the following components:      Result Value   WBC 11.7 (*)    RBC 5.23 (*)    All other components within normal limits  LIPASE, BLOOD  COMPREHENSIVE METABOLIC PANEL  URINALYSIS, ROUTINE W REFLEX MICROSCOPIC  PREGNANCY, URINE    EKG EKG Interpretation Date/Time:  Monday September 15 2023 00:21:55 EST Ventricular Rate:  70 PR Interval:  117 QRS Duration:  111 QT Interval:  463 QTC  Calculation: 500 R Axis:   64  Text Interpretation: Sinus rhythm Borderline short PR interval Nonspecific T abnormalities, diffuse leads Borderline prolonged QT interval Confirmed by Geoffery Lyons ([id number]) on 09/15/2023 12:26:33 AM  Radiology No results found.  Procedures Procedures    Medications Ordered in ED Medications  sodium chloride 0.9 % bolus 1,000 mL (has no administration in time range)    ED Course/ Medical Decision Making/ A&P  This patient is a 43 year old female presenting with right upper quadrant and epigastric pain that started after eating dinner this evening.  Patient arrives here uncomfortable, but with stable vital signs and is afebrile.  Physical examination reveals tenderness to the epigastrium and right upper quadrant.  Laboratory studies obtained including CBC, CMP, and lipase.  She has a slight leukocytosis with white count of 11.7 and lipase of 55, but laboratory studies otherwise unremarkable.  CT scan of the abdomen and pelvis shows acute cholecystitis.  Patient has been given Rocephin and Flagyl.  She has declined any medication for pain or nausea.  Care was discussed with Dr. Tresea Mall from general surgery.  The plan is for the patient to remain in the emergency department overnight until operating room time is available.  When available, patient will be sent to preop.  Final Clinical Impression(s) / ED Diagnoses Final diagnoses:  None    Rx / DC Orders ED Discharge Orders     None         Geoffery Lyons, MD 09/15/23 [phone]

## 2023-09-15 NOTE — Plan of Care (Signed)
  Problem: Activity: Goal: Risk for activity intolerance will decrease Outcome: Progressing   Problem: Nutrition: Goal: Adequate nutrition will be maintained Outcome: Progressing   Problem: Coping: Goal: Level of anxiety will decrease Outcome: Progressing   Problem: Elimination: Goal: Will not experience complications related to bowel motility Outcome: Progressing   Problem: Pain Managment: Goal: General experience of comfort will improve and/or be controlled Outcome: Progressing   Problem: Safety: Goal: Ability to remain free from injury will improve Outcome: Progressing   Problem: Skin Integrity: Goal: Risk for impaired skin integrity will decrease Outcome: Progressing

## 2023-09-15 NOTE — Consult Note (Signed)
Chief Complaint: Patient was seen in consultation today for cholelithiasis w/ gallbladder wall thickening concerning for acute cholecystitis  Procedure: Percutaneous cholecystostomy drain placement  Referring Physician(s): * No referring provider recorded for this case *  Supervising Physician: Irish Lack  Patient Status: [facility] - In-pt  History of Present Illness: Stephanie Fields is a 43 y.o. female with no significant medical history who initially presented to the [facility] ED 09/15/23 due to epigastric and RUQ pain for several hours that started shortly after eating dinner. States that she took some Tums with minimal pain relief. Also had multiple episodes of nausea/vomiting after which she did feel some relief. CT A/P significant for 'cholelithiasis with gallbladder wall thickening and/or pericholecystic fluid concerning for acute cholecystitis.' Surgery consulted and, per pt, they do not feel she is a surgical candidate at this time due to gallbladder wall thickening and/or pericholecystic fluid. IR consulted for percutaneous cholecystostomy drain placement. Images reviewed by Dr. Sarita Haver and approved for drain placement.   Patient resting comfortably in bed with sister at bedside. States that since midnight she has felt a relief in her nausea and RUQ pain. She denies any fevers/chills, recent vomiting, or diarrhea. NPO since arrival at midnight. Not on any blood thinners.   Code Status: Full code per pt  Past Medical History:  Diagnosis Date   Allergy    Asthma    Exercise induced   Borderline hyperlipidemia    Depression    Diabetes mellitus without complication (HCC)    Gestational   Family history of basal cell carcinoma    Family history of breast cancer    Family history of melanoma    Family history of pancreatic cancer    Family history of prostate cancer    Hypertension    Pre eclampsia   Kidney stones    Obesity     Past Surgical History:  Procedure  Laterality Date   EYE SURGERY  02/06/23   LASIK Bilateral 02/06/2023   TOOTH EXTRACTION      Allergies: Bupropion  Medications: Prior to Admission medications   Medication Sig Start Date End Date Taking? Authorizing Provider  cholecalciferol (VITAMIN D3) 25 MCG (1000 UNIT) tablet Take 1,000 Units by mouth daily.    [provider]  FLUoxetine (PROZAC) 20 MG capsule Take 1 capsule (20 mg total) by mouth daily. 02/19/23   Jeani Sow, MD  levonorgestrel [facility]) 20 MCG/24HR IUD by Intrauterine route.    [provider]  lisdexamfetamine (VYVANSE) 50 MG capsule Take 1 capsule (50 mg total) by mouth every morning. 07/11/23     lisdexamfetamine (VYVANSE) 50 MG capsule Take 1 capsule (50 mg total) by mouth in the morning. 09/01/23     Lisdexamfetamine Dimesylate 20 MG CHEW Chew 1 tablet (20 mg total) by mouth daily in the morning and adjust as per directions 06/22/23     montelukast (SINGULAIR) 10 MG tablet Take 1 tablet by mouth once daily 10/04/22   Jeani Sow, MD  tamoxifen (NOLVADEX) 10 MG tablet Take 1 tablet (10 mg total) by mouth daily. 05/01/23   Serena Croissant, MD  tirzepatide (ZEPBOUND) 12.5 MG/0.5ML Pen Inject 12.5 mg into the skin once a week. 08/28/23   Jeani Sow, MD     Family History  Problem Relation Age of Onset   Early death Mother    Alcohol abuse Mother    COPD Mother    Depression Mother    Diabetes Mother    Hyperlipidemia  Mother    Hypertension Mother    Mental illness Mother    Hypothyroidism Mother    Anxiety disorder Mother    Obesity Mother    Depression Father    Diabetes Father    Hearing loss Father    Hyperlipidemia Father    Hypertension Father    Colon polyps Father 59   Prostate cancer Father 90       prostate cancer   Cancer Father    Obesity Father    Depression Sister    Depression Brother    Learning disabilities Daughter        Autism   Breast cancer Maternal Aunt 60       negative genetic test   Cancer  Maternal Aunt    Heart disease Maternal Grandmother    Breast cancer Maternal Grandmother 49       thyroid ca, breast ca,squamous ca, basal cell ca, melanoma   Basal cell carcinoma Maternal Grandmother    Melanoma Maternal Grandmother 45   Cancer Maternal Grandmother    Alcohol abuse Maternal Grandfather    Diabetes Maternal Grandfather    Heart attack Maternal Grandfather    Heart disease Maternal Grandfather    Hyperlipidemia Maternal Grandfather    Hypertension Maternal Grandfather    Arthritis Paternal Grandmother    Hearing loss Paternal Grandmother    Heart attack Paternal Grandmother    Heart disease Paternal Grandmother    Hyperlipidemia Paternal Grandmother    Heart disease Paternal Grandfather    Hyperlipidemia Paternal Grandfather    Alzheimer's disease Paternal Grandfather    Depression Sister    Depression Brother    Breast cancer Maternal Aunt 61   Cancer Maternal Aunt    Pancreatic cancer Paternal Uncle 50   Breast cancer Maternal Aunt 47       negative genetic test   Breast cancer Maternal Great-grandmother 58   Cancer Maternal Aunt    Cancer Paternal Uncle     Social History   Socioeconomic History   Marital status: Married    Spouse name: Not on file   Number of children: 4   Years of education: Not on file   Highest education level: Not on file  Occupational History   Occupation: cardiologist    Employer: [employer]  Tobacco Use   Smoking status: Never   Smokeless tobacco: Never  Vaping Use   Vaping status: Never Used  Substance and Sexual Activity   Alcohol use: Yes    Comment: 1-2 times per year   Drug use: Never   Sexual activity: Yes    Birth control/protection: I.U.D.    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 All other ROS negative.  Vital Signs: BP (!) 143/80 (BP Location: Right Arm)   Pulse 71   Temp 98.1 F (36.7 C) (Oral)   Resp 16   Ht 5\' 7"  (1.702 m)   Wt 195 lb (88.5 kg)   SpO2 100%   BMI 30.54 kg/m    Physical Exam Vitals reviewed.  Constitutional:      Appearance:  She is well-developed.  HENT:     Head: Normocephalic and atraumatic.     Mouth/Throat:     Mouth: Mucous membranes are moist.     Pharynx: Oropharynx is clear.  Cardiovascular:     Rate and Rhythm: Normal rate and regular rhythm.     Heart sounds: Normal heart sounds.  Pulmonary:     Effort: Pulmonary effort is normal.     Breath sounds: Normal breath sounds.  Abdominal:     General: Abdomen is flat.     Palpations: Abdomen is soft.     Tenderness: There is abdominal tenderness (minimal RUQ and epigastric).  Musculoskeletal:        General: Normal range of motion.     Cervical back: Normal range of motion.  Skin:    General: Skin is warm and dry.  Neurological:     General: No focal deficit present.     Mental Status: She is alert and oriented to person, place, and time. Mental status is at baseline.  Psychiatric:        Mood and Affect: Mood normal.        Behavior: Behavior normal.        Judgment: Judgment normal.     Imaging: CT ABDOMEN PELVIS W CONTRAST Result Date: 09/15/2023 CLINICAL DATA:  Epigastric pain, right upper quadrant pain, vomiting EXAM: CT ABDOMEN AND PELVIS WITH CONTRAST TECHNIQUE: Multidetector CT imaging of the abdomen and pelvis was performed using the standard protocol following bolus administration of intravenous contrast. RADIATION DOSE REDUCTION: This exam was performed according to the departmental dose-optimization program which includes automated exposure control, adjustment of the mA and/or kV according to  patient size and/or use of iterative reconstruction technique. CONTRAST:  OMNIPAQUE IOHEXOL 300 MG/ML  SOLN COMPARISON:  None Available. FINDINGS: Lower chest: No acute abnormality Hepatobiliary: Multiple gallstones within the gallbladder. Gallbladder wall thickening and/or pericholecystic fluid. Findings concerning for acute cholecystitis. No biliary ductal dilatation or focal hepatic abnormality. Pancreas: No focal abnormality or ductal dilatation. Spleen: No focal abnormality.  Normal size. Adrenals/Urinary Tract: No adrenal abnormality. No focal renal abnormality. No stones or hydronephrosis. Urinary bladder is unremarkable. Stomach/Bowel: Normal appendix. Stomach, large and small bowel grossly unremarkable. Vascular/Lymphatic: No evidence of aneurysm or adenopathy. Reproductive: IUD in the uterus. Multiple fibroids, the largest in the left lower uterine segment measuring 7.2 cm. No adnexal mass. Other: No free fluid or free air. Musculoskeletal: No acute bony abnormality. IMPRESSION: Cholelithiasis. Gallbladder wall thickening and/or pericholecystic fluid concerning for acute cholecystitis. Fibroid uterus. Electronically Signed   By: Charlett Nose M.D.   On: 09/15/2023 01:54    Labs:  CBC: Recent Labs    11/20/22 0000 09/15/23 0026  WBC 9.5 11.7*  HGB 14.7 14.8  HCT 45 43.1  PLT 238 258    COAGS: Recent Labs    09/15/23 0807  INR 1.1    BMP: Recent Labs    11/19/22 0000 11/20/22 0000 09/15/23 0026  NA 144  --  143  K 4.6  --  3.1*  CL 108  --  105  CO2 18  --  26  GLUCOSE  --   --  116*  BUN 16  --  11  CALCIUM  --  9.2 9.3  CREATININE 0.8  --  0.79  GFRNONAA  --   --  >60    LIVER FUNCTION TESTS: Recent Labs    11/19/22 0000 11/20/22 0000 09/15/23 0026  BILITOT  --   --  0.5  AST 14  --  30  ALT 17  --  22  ALKPHOS 65  --  57  PROT  --   --  7.0  ALBUMIN  --  4.1 4.3    TUMOR MARKERS: No results for input(s): "AFPTM", "CEA", "CA199", "CHROMGRNA" in  the last 8760 hours.  Assessment and Plan:  43 y.o. female with no significant medical history who initially presented to the [facility] ED 09/15/23 due to epigastric and RUQ pain for several hours that started shortly after eating dinner. CT A/P significant for 'cholelithiasis with gallbladder wall thickening and/or pericholecystic fluid concerning for acute cholecystitis.' Surgery consulted and, per pt, they do not feel she is a surgical candidate at this time due to gallbladder wall thickening and/or pericholecystic fluid. IR consulted for percutaneous cholecystostomy drain placement. Images reviewed by Dr. Sarita Haver and approved for drain placement.   -NPO since midnight Plan for percutaneous cholecystostomy drain placement 09/15/23 with Dr. Sarita Haver  Risks and benefits discussed with the patient including bleeding, infection, damage to adjacent structures, bowel perforation/fistula connection, and sepsis.  All of the patient's questions were answered, patient is agreeable to proceed. Consent signed and in chart.   Thank you for this interesting consult. I greatly enjoyed meeting Stephanie Fields and look forward to participating in their care. A copy of this report was sent to the requesting provider on this date.  Electronically Signed: Jama Flavors, PA-C 09/15/2023, 11:37 AM   I spent a total of 40 Minutes  in face to face clinical consultation, greater than 50% of which was counseling/coordinating care for percutaneous cholecystostomy drain placement.

## 2023-09-15 NOTE — ED Notes (Signed)
ED Provider at bedside. 

## 2023-09-15 NOTE — ED Triage Notes (Signed)
Pt coming in via [facility] for cholecystitis from [facility]. Pt is alert and oriented. Pt reporting gcs 15. Pt reports 2/10 pain.

## 2023-09-15 NOTE — ED Notes (Signed)
Thomas from CL called, Gaynelle Adu receiving. Orders to be put in for med surg bed. Patient will go to med surg or IR based on first available. 20:08

## 2023-09-15 NOTE — ED Notes (Signed)
Patient transported to CT 

## 2023-09-15 NOTE — ED Notes (Signed)
Per Maisie Fus, no med surg beds available at this time. Dr. Jearld Fenton informed that pt will go to ED once she communicates with [facility]

## 2023-09-15 NOTE — ED Notes (Signed)
Pt  back in room from IR. 28F drain in right abdomen, drainage site is well appearing and drain is draining. Pt reports pain, IR RN gave PRN analgesic.

## 2023-09-15 NOTE — ED Notes (Addendum)
Urine collected and walked to lab at this time.

## 2023-09-15 NOTE — H&P (Signed)
CC: Right upper quadrant pain  Requesting provider: Dr. Judd Lien  HPI: Stephanie Fields is an 43 y.o. female who is here for evaluation because of acute onset of right upper quadrant pain that started Sunday evening.  It was associated with nausea but no vomiting.  She did have vomiting until the emergency room.  No fever or chills.  The pain was quite severe and since it was not going away after about an hour she asked her husband to take her to the emergency room.  It did not radiate.  She tried Tums without relief.  She reports that she has been having some mild upper abdominal discomfort on and off for the past few weeks which is generally relieved with Tums.  She has been on Zepbound and is currently on 12.5 mg.  Her last dose was around January 7.  No prior abdominal surgery.  No melena hematochezia.  No hematemesis.  No chest pain or chest pressure.  Does may be heard a little bit to take a deep breath since the pain started.  Past Medical History:  Diagnosis Date   Allergy    Asthma    Exercise induced   Borderline hyperlipidemia    Depression    Diabetes mellitus without complication (HCC)    Gestational   Family history of basal cell carcinoma    Family history of breast cancer    Family history of melanoma    Family history of pancreatic cancer    Family history of prostate cancer    Hypertension    Pre eclampsia   Kidney stones    Obesity     Past Surgical History:  Procedure Laterality Date   EYE SURGERY  02/06/23   LASIK Bilateral 02/06/2023   TOOTH EXTRACTION      Family History  Problem Relation Age of Onset   Early death Mother    Alcohol abuse Mother    COPD Mother    Depression Mother    Diabetes Mother    Hyperlipidemia Mother    Hypertension Mother    Mental illness Mother    Hypothyroidism Mother    Anxiety disorder Mother    Obesity Mother    Depression Father    Diabetes Father    Hearing loss Father    Hyperlipidemia Father     Hypertension Father    Colon polyps Father 85   Prostate cancer Father 76       prostate cancer   Cancer Father    Obesity Father    Depression Sister    Depression Brother    Learning disabilities Daughter        Autism   Breast cancer Maternal Aunt 60       negative genetic test   Cancer Maternal Aunt    Heart disease Maternal Grandmother    Breast cancer Maternal Grandmother 59       thyroid ca, breast ca,squamous ca, basal cell ca, melanoma   Basal cell carcinoma Maternal Grandmother    Melanoma Maternal Grandmother 92   Cancer Maternal Grandmother    Alcohol abuse Maternal Grandfather    Diabetes Maternal Grandfather    Heart attack Maternal Grandfather    Heart disease Maternal Grandfather    Hyperlipidemia Maternal Grandfather    Hypertension Maternal Grandfather    Arthritis Paternal Grandmother    Hearing loss Paternal Grandmother    Heart attack Paternal Grandmother    Heart disease Paternal Grandmother    Hyperlipidemia Paternal Grandmother  Heart disease Paternal Grandfather    Hyperlipidemia Paternal Grandfather    Alzheimer's disease Paternal Grandfather    Depression Sister    Depression Brother    Breast cancer Maternal Aunt 80   Cancer Maternal Aunt    Pancreatic cancer Paternal Uncle 18   Breast cancer Maternal Aunt 47       negative genetic test   Breast cancer Maternal Great-grandmother 31   Cancer Maternal Aunt    Cancer Paternal Uncle     Social:  reports that she has never smoked. She has never used smokeless tobacco. She reports current alcohol use. She reports that she does not use drugs.  Allergies:  Allergies  Allergen Reactions   Bupropion Hives and Rash    Medications: I have reviewed the patient's current medications.   ROS - all of the below systems have been reviewed with the patient and positives are indicated with bold text General: chills, fever or night sweats Eyes: blurry vision or double vision ENT: epistaxis or sore  throat Allergy/Immunology: itchy/watery eyes or nasal congestion Hematologic/Lymphatic: bleeding problems, blood clots or swollen lymph nodes Endocrine: temperature intolerance or unexpected weight changes Breast: new or changing breast lumps or nipple discharge Resp: cough, shortness of breath, or wheezing CV: chest pain or dyspnea on exertion GI: as per HPI GU: dysuria, trouble voiding, or hematuria MSK: joint pain or joint stiffness Neuro: TIA or stroke symptoms Derm: pruritus and skin lesion changes Psych: anxiety and depression  PE Blood pressure (!) 143/80, pulse 71, temperature 98.1 F (36.7 C), temperature source Oral, resp. rate 16, height 5\' 7"  (1.702 m), weight 88.5 kg, SpO2 100%. Constitutional: NAD; conversant; no deformities Eyes: Moist conjunctiva; no lid lag; anicteric; PERRL Neck: Trachea midline; no thyromegaly Lungs: Normal respiratory effort; no tactile fremitus; cta b/l CV: RRR; no palpable thrills; no pitting edema GI: Abd soft, tender to palpation right upper quadrant, no rebound or guarding no peritonitis; no palpable hepatosplenomegaly MSK:  no clubbing/cyanosis Psychiatric: Appropriate affect; alert and oriented x3 Lymphatic: No palpable cervical or axillary lymphadenopathy Skin: No rash, lesions or jaundice  Results for orders placed or performed during the hospital encounter of 09/15/23 (from the past 48 hours)  Lipase, blood     Status: Abnormal   Collection Time: 09/15/23 12:26 AM  Result Value Ref Range   Lipase 55 (H) 11 - 51 U/L    Comment: Performed at [facility], [address], [place], [place] [address]  Comprehensive metabolic panel     Status: Abnormal   Collection Time: 09/15/23 12:26 AM  Result Value Ref Range   Sodium 143 135 - 145 mmol/L   Potassium 3.1 (L) 3.5 - 5.1 mmol/L   Chloride 105 98 - 111 mmol/L   CO2 26 22 - 32 mmol/L   Glucose, Bld 116 (H) 70 - 99 mg/dL    Comment: Glucose reference range applies  only to samples taken after fasting for at least 8 hours.   BUN 11 6 - 20 mg/dL   Creatinine, Ser 1.91 0.44 - 1.00 mg/dL   Calcium 9.3 8.9 - 47.8 mg/dL   Total Protein 7.0 6.5 - 8.1 g/dL   Albumin 4.3 3.5 - 5.0 g/dL   AST 30 15 - 41 U/L   ALT 22 0 - 44 U/L   Alkaline Phosphatase 57 38 - 126 U/L   Total Bilirubin 0.5 0.0 - 1.2 mg/dL   GFR, Estimated >29 >56 mL/min    Comment: (NOTE) Calculated using the CKD-EPI  Creatinine Equation (2021)    Anion gap 12 5 - 15    Comment: Performed at [facility], [address] [place], [place] [address]  CBC     Status: Abnormal   Collection Time: 09/15/23 12:26 AM  Result Value Ref Range   WBC 11.7 (H) 4.0 - 10.5 K/uL   RBC 5.23 (H) 3.87 - 5.11 MIL/uL   Hemoglobin 14.8 12.0 - 15.0 g/dL   HCT 60.4 54.0 - 98.1 %   MCV 82.4 80.0 - 100.0 fL   MCH 28.3 26.0 - 34.0 pg   MCHC 34.3 30.0 - 36.0 g/dL   RDW 19.1 47.8 - 29.5 %   Platelets 258 150 - 400 K/uL   nRBC 0.0 0.0 - 0.2 %    Comment: Performed at [facility], [address] [place], [place] [address]  Troponin I (High Sensitivity)     Status: None   Collection Time: 09/15/23 12:39 AM  Result Value Ref Range   Troponin I (High Sensitivity) 3 <18 ng/L    Comment: (NOTE) Elevated high sensitivity troponin I (hsTnI) values and significant  changes across serial measurements may suggest ACS but many other  chronic and acute conditions are known to elevate hsTnI results.  Refer to the "Links" section for chest pain algorithms and additional  guidance. Performed at [facility], [address], [place], [place] [address]   Urinalysis, Routine w reflex microscopic -Urine, Clean Catch     Status: Abnormal   Collection Time: 09/15/23  1:05 AM  Result Value Ref Range   Color, Urine YELLOW YELLOW   APPearance HAZY (A) CLEAR   Specific Gravity, Urine 1.010 1.005 - 1.030   pH 8.0 5.0 - 8.0   Glucose, UA NEGATIVE NEGATIVE mg/dL   Hgb  urine dipstick TRACE (A) NEGATIVE   Bilirubin Urine NEGATIVE NEGATIVE   Ketones, ur NEGATIVE NEGATIVE mg/dL   Protein, ur NEGATIVE NEGATIVE mg/dL   Nitrite NEGATIVE NEGATIVE   Leukocytes,Ua NEGATIVE NEGATIVE   RBC / HPF 0-5 0 - 5 RBC/hpf   WBC, UA 0-5 0 - 5 WBC/hpf   Bacteria, UA RARE (A) NONE SEEN   Squamous Epithelial / HPF 0-5 0 - 5 /HPF   Mucus PRESENT    Amorphous Crystal PRESENT     Comment: Performed at [facility], [address] [place], [place], [place] [address]  Pregnancy, urine     Status: None   Collection Time: 09/15/23  1:05 AM  Result Value Ref Range   Preg Test, Ur NEGATIVE NEGATIVE    Comment:        THE SENSITIVITY OF THIS METHODOLOGY IS >25 mIU/mL. Performed at [facility], [address] [place], [place] [address]   Protime-INR     Status: None   Collection Time: 09/15/23  8:07 AM  Result Value Ref Range   Prothrombin Time 14.6 11.4 - 15.2 seconds   INR 1.1 0.8 - 1.2    Comment: (NOTE) INR goal varies based on device and disease states. Performed at [facility], [address] [place], [place] [address]     CT ABDOMEN PELVIS W CONTRAST Result Date: 09/15/2023 CLINICAL DATA:  Epigastric pain, right upper quadrant pain, vomiting EXAM: CT ABDOMEN AND PELVIS WITH CONTRAST TECHNIQUE: Multidetector CT imaging of the abdomen and pelvis was performed using the standard protocol following bolus administration of intravenous contrast. RADIATION DOSE REDUCTION: This exam was performed according to the departmental dose-optimization program which includes automated exposure control,  adjustment of the mA and/or kV according to patient size and/or use of iterative reconstruction technique. CONTRAST:  OMNIPAQUE IOHEXOL 300 MG/ML  SOLN COMPARISON:  None Available. FINDINGS: Lower chest: No acute abnormality Hepatobiliary: Multiple gallstones within the gallbladder. Gallbladder wall thickening and/or  pericholecystic fluid. Findings concerning for acute cholecystitis. No biliary ductal dilatation or focal hepatic abnormality. Pancreas: No focal abnormality or ductal dilatation. Spleen: No focal abnormality.  Normal size. Adrenals/Urinary Tract: No adrenal abnormality. No focal renal abnormality. No stones or hydronephrosis. Urinary bladder is unremarkable. Stomach/Bowel: Normal appendix. Stomach, large and small bowel grossly unremarkable. Vascular/Lymphatic: No evidence of aneurysm or adenopathy. Reproductive: IUD in the uterus. Multiple fibroids, the largest in the left lower uterine segment measuring 7.2 cm. No adnexal mass. Other: No free fluid or free air. Musculoskeletal: No acute bony abnormality. IMPRESSION: Cholelithiasis. Gallbladder wall thickening and/or pericholecystic fluid concerning for acute cholecystitis. Fibroid uterus. Electronically Signed   By: Charlett Nose M.D.   On: 09/15/2023 01:54    Imaging: Personally reviewed  A/P: Stephanie Fields is an 43 y.o. female with  Acute calculous cholecystitis Hypokalemia Mildly elevated lipase Uterine fibroids  Admit inpatient IV antibiotics Replace potassium Plan cholecystostomy tube with interventional radiology -discussed with interventional radiologist VTE prophylaxis-SCDs today, start subcu chemical VTE prophylaxis tomorrow FEN - npo except meds until after IR, replace lytes in am.    In reviewing her CT scan her gallbladder wall is extremely thick-walled.  I think the "indigestion" she has been having on and off for the past few weeks is actually been gallbladder attacks.  Given how severe her gallbladder wall is and how thickened and inflamed areas I think there would be a high chance of ended up doing a subtotal/fenestrated cholecystectomy with the potential risk for bile leak.  Therefore I recommended management of her acute cholecystitis during this admission with cholecystostomy tube by interventional radiology.  I  discussed pros and cons of operative intervention during this admission versus delayed intervention.  We then discussed cholecystostomy tube and its typical management.  We discussed outpatient cholangiogram through the cholecystostomy tube in about 6 weeks to evaluate the cystic duct.  I would still recommend interval cholecystectomy because of the risk of recurrent cholecystitis.  All of her questions were asked and answered.  Data reviewed: I reviewed the ER notes, CT imaging, labs, PCP outpatient note, discussed and coordinated her care with interventional radiology as well as the ER physician at [facility]  High-level medical decision making  Mary Sella. Andrey Campanile, MD, FACS General, Bariatric, & Minimally Invasive Surgery [facility]

## 2023-09-15 NOTE — Procedures (Signed)
Interventional Radiology Procedure Note  Procedure: Percutaneous cholecystostomy tube placement  Complications: None  Estimated Blood Loss: None  Findings: 10 Fr percutaneous drain placed in GB via lower transhepatic approach. Return of dark bile. Numerous gallstones. No cystic duct outflow visualized. Drain to gravity bag. Bile sample sent for culture.  Jodi Marble. Fredia Sorrow, M.D Pager:  [phone]

## 2023-09-15 NOTE — ED Notes (Signed)
PT well appearing upon transport. 

## 2023-09-15 NOTE — ED Provider Notes (Signed)
7:11 AM Assumed care of patient from off-going team. For more details, please see note from same day.  In brief, this is a 43 y.o. female  who p/w acute cholecystitis  Plan/Dispo at time of sign-out & ED Course since sign-out: [ ]  awaiting call for [facility] at [facility]  BP 118/83   Pulse 71   Temp 99.7 F (37.6 C) (Oral)   Resp 18   Ht 5\' 7"  (1.702 m)   Wt 88.5 kg   SpO2 96%   BMI 30.54 kg/m    ED Course:   Clinical Course as of 09/15/23 0849  Mon Sep 15, 2023  0724 Patient evaluated. Pain is 2/10, no nausea now.  [HN]  0801 D/w Dr. Andrey Campanile w/ surgery who states she will need a perc drain. [facility] will be admitted to [facility] and get perc drain. Dr. Andrey Campanile will admit and determine if she will go straight to IR.  [HN]  0830 Discussed with Dr. Andrey Campanile as well as Dr. Fredia Sorrow with IR.  Patient can go straight to IR this morning, but due to bed availability will need to be transferred ED to ED.  Patient is accepted by Dr. Lorre Nick for ED to ED transfer to [facility] in order to get to IR. [HN]    Clinical Course User Index [HN] Loetta Rough, MD    Dispo: ED to ED accepted by Dr. Freida Busman in order to go to IR w/ Dr. Fredia Sorrow, admitted w/ Dr. Andrey Campanile ------------------------------- Vivi Barrack, MD Emergency Medicine  This note was created using dictation software, which may contain spelling or grammatical errors.   Loetta Rough, MD 09/15/23 [phone]

## 2023-09-15 NOTE — ED Triage Notes (Signed)
Pt POV from home reporting epigastric pain after eating, assumed it was reflux, took tums without relief. Pain now RUQ, multiple episodes of emesis in the past hour.

## 2023-09-15 NOTE — Sedation Documentation (Signed)
Patient having increased pain post percutaneous chole tube placement. Patient is diaphoretic and pale and states "hx of fainting". Per Dr. Fredia Sorrow, ok to give more pain medication at this time. On cardiac telemetry and vitals monitored.

## 2023-09-16 ENCOUNTER — Other Ambulatory Visit: Payer: Self-pay

## 2023-09-16 ENCOUNTER — Other Ambulatory Visit (HOSPITAL_BASED_OUTPATIENT_CLINIC_OR_DEPARTMENT_OTHER): Payer: Self-pay

## 2023-09-16 DIAGNOSIS — K8001 Calculus of gallbladder with acute cholecystitis with obstruction: Secondary | ICD-10-CM | POA: Diagnosis not present

## 2023-09-16 LAB — CBC
HCT: 39.3 % (ref 36.0–46.0)
Hemoglobin: 13.1 g/dL (ref 12.0–15.0)
MCH: 28.4 pg (ref 26.0–34.0)
MCHC: 33.3 g/dL (ref 30.0–36.0)
MCV: 85.2 fL (ref 80.0–100.0)
Platelets: 209 10*3/uL (ref 150–400)
RBC: 4.61 MIL/uL (ref 3.87–5.11)
RDW: 12.4 % (ref 11.5–15.5)
WBC: 7.1 10*3/uL (ref 4.0–10.5)
nRBC: 0 % (ref 0.0–0.2)

## 2023-09-16 LAB — COMPREHENSIVE METABOLIC PANEL
ALT: 23 U/L (ref 0–44)
AST: 22 U/L (ref 15–41)
Albumin: 3.1 g/dL — ABNORMAL LOW (ref 3.5–5.0)
Alkaline Phosphatase: 42 U/L (ref 38–126)
Anion gap: 7 (ref 5–15)
BUN: <5 mg/dL — ABNORMAL LOW (ref 6–20)
CO2: 23 mmol/L (ref 22–32)
Calcium: 8.2 mg/dL — ABNORMAL LOW (ref 8.9–10.3)
Chloride: 109 mmol/L (ref 98–111)
Creatinine, Ser: 0.72 mg/dL (ref 0.44–1.00)
GFR, Estimated: >60 mL/min (ref >60)
Glucose, Bld: 86 mg/dL (ref 70–99)
Potassium: 3.5 mmol/L (ref 3.5–5.1)
Sodium: 139 mmol/L (ref 135–145)
Total Bilirubin: 0.5 mg/dL (ref 0.0–1.2)
Total Protein: 5.5 g/dL — ABNORMAL LOW (ref 6.5–8.1)

## 2023-09-16 MED ORDER — POLYETHYLENE GLYCOL 3350 17 G PO PACK: 17.0000 g | PACK | Freq: Every day | ORAL | Status: DC | PRN

## 2023-09-16 MED ORDER — AMOXICILLIN-POT CLAVULANATE 875-125 MG PO TABS
1.0000 | ORAL_TABLET | Freq: Two times a day (BID) | ORAL | 0 refills | Status: AC
  Filled 2023-09-16: qty 14, 7d supply, fill #0

## 2023-09-16 MED ORDER — OXYCODONE HCL 5 MG PO TABS
5.0000 mg | ORAL_TABLET | Freq: Four times a day (QID) | ORAL | 0 refills | Status: AC | PRN
  Filled 2023-09-16: qty 9, 3d supply, fill #0

## 2023-09-16 MED ORDER — ACETAMINOPHEN 500 MG PO TABS: 1000.0000 mg | ORAL_TABLET | Freq: Four times a day (QID) | ORAL | Status: DC | PRN

## 2023-09-16 MED ORDER — SODIUM CHLORIDE FLUSH 0.9 % IV SOLN
10.0000 mL | Freq: Every day | INTRAVENOUS | 2 refills | Status: DC
  Filled 2023-09-16: qty 300, 30d supply, fill #0

## 2023-09-16 NOTE — Progress Notes (Signed)
Reviewed AVS, patient expressed understanding of medications, MD follow up reviewed.  Removed IV, Site clean, dry and intact.  Informed patient where to pick up prescriptions.  Pt transported to entrance A where family member was waiting in vehicle to transport home.

## 2023-09-16 NOTE — Discharge Instructions (Signed)
Interventional Radiology Percutaneous Cholecystostomy Drain Placement After Care   This sheet gives you information about how to care for yourself after your procedure. Your health care provider may also give you more specific instructions. Your drain was placed by an interventional radiologist with [facility]. If you have questions or concerns, contact [facility] at [phone].   What is a percutaneous drain?   A cholecystostomy drain is a small plastic tube (catheter) that goes into the gallbladder in your body through your skin.   How long will I need the drain?   How long the drain needs to stay in is determined by where the drain is, how much comes out of the drain each day and if you are having any other surgical procedures.   Interventional radiology will determine when it is time to remove the drain. It is important to follow up as directed so that the drain can be removed as soon as it is safe to do so.   What can I expect after the procedure?   After the procedure, it is common to have:   A small amount of bruising and discomfort in the area where the drainage tube (catheter) was placed.   Sleepiness and fatigue. This should go away after the medicines you were given have worn off.   Follow these instructions at home:   Insertion site care   Check your insertion site when you change the bandage. Check for:   More redness, swelling, or pain.   More fluid or blood.   Warmth.   Pus or a bad smell.   When caring for your insertion site:   Wash your hands with soap and water for at least 20 seconds before and after you change your bandage (dressing). If soap and water are not available, use hand sanitizer.   You do not need to change your dressing everyday if it is clean and dry. Change your dressing every 3 days or as needed when it is soiled, wet or becoming dislodged. You will need to change your dressing each time you shower.   Leave stitches  (sutures), skin glue, or adhesive strips in place. These skin closures may need to stay in place for 2 weeks or longer. If adhesive strip edges start to loosen and curl up, you may trim the loose edges. Do not remove adhesive strips completely unless your health care provider tells you to do so.   Catheter care   Flush the catheter once per day with 5 mL of 0.9% normal saline unless you are told otherwise by your healthcare provider. This helps to prevent clogs in the catheter.   To disconnect the drain, turn the clear plastic tube to the left. Attach the saline syringe by placing it on the white end of the drain and turning gently to the right. Once attached gently push the plunger to the 5 mL mark. After you are done flushing, disconnect the syringe by turning to the left and reattach your drainage container   If you have a bulb please be sure the bulb is charged after reconnecting it - to do this pinch the bulb between your thumb and first finger and close the stopper located on the top of the bulb.    Check for fluid leaking from around your catheter (instead of fluid draining through your catheter). This may be a sign that the drain is no longer working correctly.   Write down the following information every time you empty your  bag:   The date and time.   The amount of drainage.   Activity   Rest at home for 1-2 days after your procedure.   For the first 48 hours do not lift anything more than 10 lbs (about a gallon of milk). You may perform moderate activities/exercise. Please avoid strenuous activities during this time.   Avoid any activities which may pull on your drain as this can cause your drain to become dislodged.   If you were given a sedative during the procedure, it can affect you for several hours. Do not drive or operate machinery until your health care provider says that it is safe.   General instructions   For mild pain take over-the-counter medications as needed for  pain such as Tylenol or Advil. If you are experiencing severe pain please call our office as this may indicate an issue with your drain.    If you were prescribed an antibiotic medicine, take it as told by your health care provider. Do not stop using the antibiotic even if you start to feel better.   You may shower 24 hours after the drain is placed. To do this cover the insertion site with a water tight material such as saran wrap and seal the edges with tape, you may also purchase waterproof dressings at your local drug store. Shower as usual and then remove the water tight dressing and any gauze/tape underneath it once you have exited the shower and dried off. Allow the area to air dry or pat dry with a clean towel. Once the skin is completely dry place a new gauze dressing. It is important to keep the site dry at all times to prevent infection.   Do not submerge the drain - this means you cannot take baths, swim, use a hot tub, etc. until the drain is removed.    Do not use any products that contain nicotine or tobacco, such as cigarettes, e-cigarettes, and chewing tobacco. If you need help quitting, ask your health care provider.   Keep all follow-up visits as told by your health care provider. This is important.   Contact a health care provider if:   You have less than 10 mL of drainage a day for 2-3 days in a row, or as directed by your health care provider.   You have any of these signs of infection:   More redness, swelling, or pain around your incision area.   More fluid or blood coming from your incision area.   Warmth coming from your incision area.   Pus or a bad smell coming from your incision area.   You have fluid leaking from around your catheter (instead of through your catheter).   You are unable to flush the drain.   You have a fever or chills.   You have pain that does not get better with medicine.   You have not been contacted to schedule a drain follow up  appointment within 10 days of discharge from the hospital.   Please call [facility] at [phone] with any questions or concerns.   Get help right away if:   Your catheter comes out.   You suddenly stop having drainage from your catheter.   You suddenly have blood in the fluid that is draining from your catheter.   You become dizzy or you faint.   You develop a rash.   You have nausea or vomiting.   You have difficulty breathing or you feel short  of breath.   You develop chest pain.   You have problems with your speech or vision.   You have trouble balancing or moving your arms or legs.   Summary   It is common to have a small amount of bruising and discomfort in the area where the drainage tube (catheter) was placed. You may also have minor discomfort with movement while the drain is in place.   Flush the drain once per day with 5 mL of 0.9% normal saline (unless you were told otherwise by your healthcare provider).    Record the amount of drainage from the bag every time you empty it.   Change the dressing every 3 days or earlier if soiled/wet. Keep the skin dry under the dressing.   You may shower with the drain in place. Do not submerge the drain (no baths, swimming, hot tubs, etc.).   Contact [facility] at [phone] if you have more redness, swelling, or pain around your incision area or if you have pain that does not get better with medicine.   This information is not intended to replace advice given to you by your health care provider. Make sure you discuss any questions you have with your health care provider.   Document Revised: 11/15/2021 Document Reviewed: 08/07/2019   Elsevier Patient Education  2023 [employer]         Interventional Radiology Drain Record   Empty your drain at least once per day. You may empty it as often as needed. Use this form to write down the amount of fluid that has collected in the drainage  container. Bring this form with you to your follow-up visits. Please call [facility] at [phone] with any questions or concerns prior to your appointment.   Drain #1 location: ___________________   Date __________ Time __________ Amount __________   Date __________ Time __________ Amount __________   Date __________ Time __________ Amount __________   Date __________ Time __________ Amount __________   Date __________ Time __________ Amount __________   Date __________ Time __________ Amount __________   Date __________ Time __________ Amount __________   Date __________ Time __________ Amount __________   Date __________ Time __________ Amount __________   Date __________ Time __________ Amount __________   Date __________ Time __________ Amount __________   Date __________ Time __________ Amount __________   Date __________ Time __________ Amount __________   Date __________ Time __________ Amount __________

## 2023-09-16 NOTE — Progress Notes (Signed)
Progress Note     Subjective: Initially post drain placement with some significant pain but now improved. Tolerating clears without n/v/worsening abdominal pain.    Objective: Vital signs in last 24 hours: Temp:  [97.8 F (36.6 C)-98.6 F (37 C)] 98.3 F (36.8 C) (01/21 0409) Pulse Rate:  [62-71] 64 (01/21 0409) Resp:  [11-29] 16 (01/21 0409) BP: (124-169)/(71-92) 125/77 (01/21 0409) SpO2:  [96 %-100 %] 96 % (01/21 0409) FiO2 (%):  [100 %] 100 % (01/20 1320) Weight:  [88.5 kg] 88.5 kg (01/20 0930) Last BM Date : 09/14/23  Intake/Output from previous day: 01/20 0701 - 01/21 0700 In: 1934.2 [I.V.:1634.2; IV Piggyback:100] Out: 125 [Drains:125] Intake/Output this shift: No intake/output data recorded.  PE: General: pleasant, WD, female who is laying in bed in NAD Lungs:   Respiratory effort nonlabored on room air Abd: soft, expected TTP around per chole drain which has dressing cdi and draining bilious fluid MSK: all 4 extremities are symmetrical with no cyanosis, clubbing, or edema. Skin: warm and dry Psych: A&Ox3 with an appropriate affect.    Lab Results:  Recent Labs    09/15/23 0026 09/16/23 0613  WBC 11.7* 7.1  HGB 14.8 13.1  HCT 43.1 39.3  PLT 258 209   BMET Recent Labs    09/15/23 0026 09/16/23 0613  NA 143 139  K 3.1* 3.5  CL 105 109  CO2 26 23  GLUCOSE 116* 86  BUN 11 <5*  CREATININE 0.79 0.72  CALCIUM 9.3 8.2*   PT/INR Recent Labs    09/15/23 0807  LABPROT 14.6  INR 1.1   CMP     Component Value Date/Time   NA 139 09/16/2023 0613   NA 144 11/19/2022 0000   K 3.5 09/16/2023 0613   CL 109 09/16/2023 0613   CO2 23 09/16/2023 0613   GLUCOSE 86 09/16/2023 0613   BUN <5 (L) 09/16/2023 0613   BUN 16 11/19/2022 0000   CREATININE 0.72 09/16/2023 0613   CREATININE 0.74 05/14/2019 1514   CALCIUM 8.2 (L) 09/16/2023 0613   PROT 5.5 (L) 09/16/2023 0613   PROT 6.6 07/25/2021 0910   ALBUMIN 3.1 (L) 09/16/2023 0613   ALBUMIN 4.5  07/25/2021 0910   AST 22 09/16/2023 0613   ALT 23 09/16/2023 0613   ALKPHOS 42 09/16/2023 0613   BILITOT 0.5 09/16/2023 0613   BILITOT 0.3 07/25/2021 0910   GFRNONAA >60 09/16/2023 0613   GFRAA >60 11/05/2019 1945   Lipase     Component Value Date/Time   LIPASE 55 (H) 09/15/2023 0026       Studies/Results: IR Perc Cholecystostomy Result Date: 09/15/2023 CLINICAL DATA:  Acute calculus cholecystitis and need for percutaneous cholecystostomy. EXAM: PERCUTANEOUS CHOLECYSTOSTOMY COMPARISON:  None Available. ANESTHESIA/SEDATION: Moderate (conscious) sedation was employed during this procedure. A total of Versed 2.0 mg and Fentanyl 150 mcg was administered intravenously. Moderate Sedation Time: 19 minutes. The patient's level of consciousness and vital signs were monitored continuously by radiology nursing throughout the procedure under my direct supervision. CONTRAST:  10mL OMNIPAQUE IOHEXOL 300 MG/ML  SOLN MEDICATIONS: 1 mg IV dilaudid FLUOROSCOPY TIME:  2 minutes and 24 seconds.  80.0 mGy. PROCEDURE: The procedure, risks, benefits, and alternatives were explained to the patient. Questions regarding the procedure were encouraged and answered. The patient understands and consents to the procedure. A time-out was performed prior to initiating the procedure. The right abdominal wall was prepped with chlorhexidine in a sterile fashion, and a sterile drape was applied covering the  operative field. A sterile gown and sterile gloves were used for the procedure. Local anesthesia was provided with 1% Lidocaine. Ultrasound image documentation was performed. Fluoroscopy during the procedure and fluoro spot radiograph confirms appropriate catheter position. Ultrasound was utilized to localize the gallbladder. Under direct ultrasound guidance, a 21 gauge needle was advanced via a transhepatic approach into the gallbladder lumen. Aspiration was performed and a bile sample sent for culture studies. A small amount of  diluted contrast material was injected. A guide wire was then advanced into the gallbladder. A transitional dilator was placed. Percutaneous tract dilatation was then performed over a guide wire to 10-French. A 10-French pigtail drainage catheter was then advanced into the gallbladder lumen under fluoroscopy. Catheter was formed and injected with contrast material to confirm position. The catheter was flushed and connected to a gravity drainage bag. It was secured at the skin with a Prolene retention suture and Stat-Lock device. COMPLICATIONS: None FINDINGS: After needle puncture of the gallbladder, a sample of dark bile was aspirated and sent for culture. Contrast injection demonstrates numerous filling defects in the gallbladder lumen consistent with calculi. No outflow of contrast was seen via the cystic duct. The cholecystostomy tube was advanced into the gallbladder lumen and formed. It is now draining bile. This tube will be left to gravity drainage. IMPRESSION: Percutaneous cholecystostomy with placement of 10-French drainage catheter into the gallbladder lumen. This was left to gravity drainage. Electronically Signed   By: Irish Lack M.D.   On: 09/15/2023 14:44   CT ABDOMEN PELVIS W CONTRAST Result Date: 09/15/2023 CLINICAL DATA:  Epigastric pain, right upper quadrant pain, vomiting EXAM: CT ABDOMEN AND PELVIS WITH CONTRAST TECHNIQUE: Multidetector CT imaging of the abdomen and pelvis was performed using the standard protocol following bolus administration of intravenous contrast. RADIATION DOSE REDUCTION: This exam was performed according to the departmental dose-optimization program which includes automated exposure control, adjustment of the mA and/or kV according to patient size and/or use of iterative reconstruction technique. CONTRAST:  OMNIPAQUE IOHEXOL 300 MG/ML  SOLN COMPARISON:  None Available. FINDINGS: Lower chest: No acute abnormality Hepatobiliary: Multiple gallstones within the  gallbladder. Gallbladder wall thickening and/or pericholecystic fluid. Findings concerning for acute cholecystitis. No biliary ductal dilatation or focal hepatic abnormality. Pancreas: No focal abnormality or ductal dilatation. Spleen: No focal abnormality.  Normal size. Adrenals/Urinary Tract: No adrenal abnormality. No focal renal abnormality. No stones or hydronephrosis. Urinary bladder is unremarkable. Stomach/Bowel: Normal appendix. Stomach, large and small bowel grossly unremarkable. Vascular/Lymphatic: No evidence of aneurysm or adenopathy. Reproductive: IUD in the uterus. Multiple fibroids, the largest in the left lower uterine segment measuring 7.2 cm. No adnexal mass. Other: No free fluid or free air. Musculoskeletal: No acute bony abnormality. IMPRESSION: Cholelithiasis. Gallbladder wall thickening and/or pericholecystic fluid concerning for acute cholecystitis. Fibroid uterus. Electronically Signed   By: Charlett Nose M.D.   On: 09/15/2023 01:54    Anti-infectives: Anti-infectives (From admission, onward)    Start     Dose/Rate Route Frequency Ordered Stop   09/16/23 0800  cefTRIAXone (ROCEPHIN) 2 g in sodium chloride 0.9 % 100 mL IVPB        2 g 200 mL/hr over 30 Minutes Intravenous Every 24 hours 09/15/23 0808     09/15/23 0815  cefTRIAXone (ROCEPHIN) 1 g in sodium chloride 0.9 % 100 mL IVPB        1 g 200 mL/hr over 30 Minutes Intravenous  Once 09/15/23 0810 09/15/23 1325   09/15/23 0215  cefTRIAXone (ROCEPHIN)  1 g in sodium chloride 0.9 % 100 mL IVPB        1 g 200 mL/hr over 30 Minutes Intravenous  Once 09/15/23 0206 09/15/23 0248   09/15/23 0215  metroNIDAZOLE (FLAGYL) IVPB 500 mg        500 mg 100 mL/hr over 60 Minutes Intravenous  Once 09/15/23 0206 09/15/23 0354        Assessment/Plan  Acute calculous cholecystitis   S/p IR perc chole 1/20 Culture pending Leukocytosis resolved Tolerating clears, advance to regular Discharge today after reg diet and drain teaching by  IR. Dc on augmentin to complete 7 days post perc chole. I will schedule follow up with Dr. Andrey Campanile   FEN: reg ID: [email or web address] VTE: [email or web address]  I reviewed nursing notes, Consultant IR notes, last 24 h vitals and pain scores, last 48 h intake and output, last 24 h labs and trends, and last 24 h imaging results.    LOS: 0 days   Eric Form, [facility] Surgery 09/16/2023, 8:02 AM Please see Amion for pager number during day hours 7:00am-4:30pm

## 2023-09-16 NOTE — Plan of Care (Signed)

## 2023-09-16 NOTE — Progress Notes (Signed)
Referring Provider(s): Dr. Gaynelle Adu  Supervising Physician: Marliss Coots  Patient Status:  [facility] - In-pt  Chief Complaint: Cholelithiasis w/ acute cholecystitis and obstruction  Brief History:  43 y.o. female with no significant medical history who initially presented to the [facility] ED 09/15/23 due to epigastric and RUQ pain for several hours that started shortly after eating dinner. She had multiple episodes of nausea/vomiting after which she did feel some relief. CT A/P significant for 'cholelithiasis with gallbladder wall thickening and/or pericholecystic fluid concerning for acute cholecystitis.' Surgery consulted and, per pt, they do not feel she is a surgical candidate at this time due to gallbladder wall thickening and/or pericholecystic fluid. IR consulted for percutaneous cholecystostomy drain placement. Images reviewed by Dr. Sarita Haver and percutaneous cholecystostomy drain placed 09/15/23.  Subjective:  Patient resting comfortably in bed on arrival to room. States that she still has a little bit of soreness with movement, but otherwise has no complaints today. Surgery expects she will be discharged home later today. Discussed at home drain care with the patient including flushing daily with saline, keeping area clean and dry, and follow up in outpatient clinic. All of the patient's questions were answered at bedside.   Allergies: Wellbutrin [bupropion]  Medications: Prior to Admission medications   Medication Sig Start Date End Date Taking? Authorizing Provider  Cholecalciferol (VITAMIN D-3 PO) Take 1 tablet by mouth daily.   Yes [provider]  ibuprofen (ADVIL) 200 MG tablet Take 400 mg by mouth 2 (two) times daily as needed for headache or moderate pain (pain score 4-6).   Yes [provider]  lisdexamfetamine (VYVANSE) 50 MG capsule Take 1 capsule (50 mg total) by mouth in the morning. Patient taking differently: Take 50 mg by mouth daily as needed (work  days). 09/01/23  Yes   montelukast (SINGULAIR) 10 MG tablet Take 1 tablet by mouth once daily 10/04/22  Yes Jeani Sow, MD  tamoxifen (NOLVADEX) 10 MG tablet Take 1 tablet (10 mg total) by mouth daily. 05/01/23  Yes Serena Croissant, MD  tirzepatide (ZEPBOUND) 12.5 MG/0.5ML Pen Inject 12.5 mg into the skin once a week. Patient taking differently: Inject 12.5 mg into the skin every Tuesday. 08/28/23  Yes Jeani Sow, MD  acetaminophen (TYLENOL) 500 MG tablet Take 2 tablets (1,000 mg total) by mouth every 6 (six) hours as needed for mild pain (pain score 1-3). 09/16/23   Eric Form, PA-C  levonorgestrel (MIRENA) 20 MCG/24HR IUD by Intrauterine route.    [provider]  oxyCODONE (OXY IR/ROXICODONE) 5 MG immediate release tablet Take 1 tablet (5 mg total) by mouth every 6 (six) hours as needed for up to 3 days for severe pain (pain score 7-10). 09/16/23 09/19/23  Eric Form, PA-C  polyethylene glycol (MIRALAX / GLYCOLAX) 17 g packet Take 17 g by mouth daily as needed for mild constipation or moderate constipation. 09/16/23   Eric Form, PA-C     Vital Signs: BP (!) 146/99 (BP Location: Left Arm)   Pulse 66   Temp 99.1 F (37.3 C) (Oral)   Resp 18   Ht 5\' 7"  (1.702 m)   Wt 195 lb (88.5 kg)   SpO2 99%   BMI 30.54 kg/m   Physical Exam Vitals reviewed.  Constitutional:      Appearance: Normal appearance. She is well-developed.  HENT:     Head: Normocephalic and atraumatic.     Mouth/Throat:     Mouth: Mucous membranes are  moist.     Pharynx: Oropharynx is clear.  Cardiovascular:     Rate and Rhythm: Normal rate and regular rhythm.     Heart sounds: Normal heart sounds.  Pulmonary:     Effort: Pulmonary effort is normal.     Breath sounds: Normal breath sounds.  Abdominal:     General: Abdomen is flat.     Palpations: Abdomen is soft.     Comments: Perc chole drain sutured in place with approximately 75mL of bilious drainage in gravity bag. Skin is  non-erythematous, clean and dry with overlying bandage in place.   Musculoskeletal:        General: Normal range of motion.     Cervical back: Normal range of motion.  Skin:    General: Skin is warm and dry.  Neurological:     General: No focal deficit present.     Mental Status: She is alert and oriented to person, place, and time. Mental status is at baseline.  Psychiatric:        Mood and Affect: Mood normal.        Behavior: Behavior normal.        Judgment: Judgment normal.    Drain Location: RUQ Size: Fr size: 10 Fr Date of placement: 09/15/23  Currently to: Drain collection device: gravity 24 hour output:  Output by Drain (mL) 09/14/23 0701 - 09/14/23 1900 09/14/23 1901 - 09/15/23 0700 09/15/23 0701 - 09/15/23 1900 09/15/23 1901 - 09/16/23 0700 09/16/23 0701 - 09/16/23 1135  Biliary Tube Cholecystostomy 10.2 Fr. RUQ   125     Current examination: Flushes/aspirates easily.  Insertion site unremarkable. Suture and stat lock in place. Dressed appropriately.    Labs:  CBC: Recent Labs    11/20/22 0000 09/15/23 0026 09/16/23 0613  WBC 9.5 11.7* 7.1  HGB 14.7 14.8 13.1  HCT 45 43.1 39.3  PLT 238 258 209    COAGS: Recent Labs    09/15/23 0807  INR 1.1    BMP: Recent Labs    11/19/22 0000 11/20/22 0000 09/15/23 0026 09/16/23 0613  NA 144  --  143 139  K 4.6  --  3.1* 3.5  CL 108  --  105 109  CO2 18  --  26 23  GLUCOSE  --   --  116* 86  BUN 16  --  11 <5*  CALCIUM  --  9.2 9.3 8.2*  CREATININE 0.8  --  0.79 0.72  GFRNONAA  --   --  >60 >60    LIVER FUNCTION TESTS: Recent Labs    11/19/22 0000 11/20/22 0000 09/15/23 0026 09/16/23 0613  BILITOT  --   --  0.5 0.5  AST 14  --  30 22  ALT 17  --  22 23  ALKPHOS 65  --  57 42  PROT  --   --  7.0 5.5*  ALBUMIN  --  4.1 4.3 3.1*    Assessment and Plan:  43 year old female with cholelithiasis with acute cholecystitis and obstruction s/p percutaneous cholecystostomy drain placement on 09/15/23  with Dr. Sarita Haver.   Discharge planning: -Patient is set to follow up in IR clinic in the next 6 weeks -IR scheduler will contact patient with date/time of appointment -Patient should flush drain QD with 5-10cc NS, record output QD, dressing changes every 2-3 days or earlier if soiled -Please feel free to call earlier with any questions or concerns.    Electronically Signed: Jama Flavors, PA-C 09/16/2023,  11:35 AM    I spent a total of 15 Minutes at the the patient's bedside AND on the patient's hospital floor or unit, greater than 50% of which was counseling/coordinating care for drain follow up.

## 2023-09-16 NOTE — Plan of Care (Signed)
  Problem: Education: Goal: Knowledge of General Education information will improve Description: Including pain rating scale, medication(s)/side effects and non-pharmacologic comfort measures Outcome: Progressing   Problem: Health Behavior/Discharge Planning: Goal: Ability to manage health-related needs will improve Outcome: Progressing   Problem: Clinical Measurements: Goal: Ability to maintain clinical measurements within normal limits will improve Outcome: Progressing Goal: Will remain free from infection Outcome: Progressing Goal: Cardiovascular complication will be avoided Outcome: Progressing   Problem: Activity: Goal: Risk for activity intolerance will decrease Outcome: Progressing   Problem: Coping: Goal: Level of anxiety will decrease Outcome: Progressing   Problem: Safety: Goal: Ability to remain free from injury will improve Outcome: Progressing   Problem: Skin Integrity: Goal: Risk for impaired skin integrity will decrease Outcome: Progressing

## 2023-09-17 NOTE — Discharge Summary (Signed)
[facility] Discharge Summary   Patient ID: Stephanie Fields MRN: [id number] DOB/AGE: Jan 17, 1981 43 y.o.  Admit date: 09/15/2023 Discharge date: 09/16/2023  Admitting Diagnosis: Acute cholecystitis [K81.0]   Discharge Diagnosis Acute cholecystitis [K81.0]   Consultants Interventional Radiology  Imaging: IR Perc Cholecystostomy Result Date: 09/15/2023 CLINICAL DATA:  Acute calculus cholecystitis and need for percutaneous cholecystostomy. EXAM: PERCUTANEOUS CHOLECYSTOSTOMY COMPARISON:  None Available. ANESTHESIA/SEDATION: Moderate (conscious) sedation was employed during this procedure. A total of Versed 2.0 mg and Fentanyl 150 mcg was administered intravenously. Moderate Sedation Time: 19 minutes. The patient's level of consciousness and vital signs were monitored continuously by radiology nursing throughout the procedure under my direct supervision. CONTRAST:  10mL OMNIPAQUE IOHEXOL 300 MG/ML  SOLN MEDICATIONS: 1 mg IV dilaudid FLUOROSCOPY TIME:  2 minutes and 24 seconds.  80.0 mGy. PROCEDURE: The procedure, risks, benefits, and alternatives were explained to the patient. Questions regarding the procedure were encouraged and answered. The patient understands and consents to the procedure. A time-out was performed prior to initiating the procedure. The right abdominal wall was prepped with chlorhexidine in a sterile fashion, and a sterile drape was applied covering the operative field. A sterile gown and sterile gloves were used for the procedure. Local anesthesia was provided with 1% Lidocaine. Ultrasound image documentation was performed. Fluoroscopy during the procedure and fluoro spot radiograph confirms appropriate catheter position. Ultrasound was utilized to localize the gallbladder. Under direct ultrasound guidance, a 21 gauge needle was advanced via a transhepatic approach into the gallbladder lumen. Aspiration was performed and a bile sample sent for culture studies.  A small amount of diluted contrast material was injected. A guide wire was then advanced into the gallbladder. A transitional dilator was placed. Percutaneous tract dilatation was then performed over a guide wire to 10-French. A 10-French pigtail drainage catheter was then advanced into the gallbladder lumen under fluoroscopy. Catheter was formed and injected with contrast material to confirm position. The catheter was flushed and connected to a gravity drainage bag. It was secured at the skin with a Prolene retention suture and Stat-Lock device. COMPLICATIONS: None FINDINGS: After needle puncture of the gallbladder, a sample of dark bile was aspirated and sent for culture. Contrast injection demonstrates numerous filling defects in the gallbladder lumen consistent with calculi. No outflow of contrast was seen via the cystic duct. The cholecystostomy tube was advanced into the gallbladder lumen and formed. It is now draining bile. This tube will be left to gravity drainage. IMPRESSION: Percutaneous cholecystostomy with placement of 10-French drainage catheter into the gallbladder lumen. This was left to gravity drainage. Electronically Signed   By: Irish Lack M.D.   On: 09/15/2023 14:44    Procedures Dr. Fredia Sorrow (09/15/23) - percutaneous cholecystostomy  Hospital Course:  43 y.o. female who presented to [facility] ED with abdominal pain.  Workup showed acute calculous cholecystitis.  Patient was admitted and underwent procedure listed above.  Tolerated procedure well and was transferred to the floor.  Diet was advanced as tolerated.  On the date of discharge the patient was voiding well, tolerating diet, ambulating well, pain well controlled, vital signs stable, and felt stable for discharge home on Augmentin for 7 days. She will follow up with IR for management of her drain.  Patient will follow up in our office in 4-6 weeks and knows to call with questions or concerns. She will call to confirm  appointment date/time.    I or a member of my team have reviewed this patient in the  Controlled Substance Database.   Allergies as of 09/16/2023       Reactions   Wellbutrin [bupropion] Hives, Rash        Medication List     TAKE these medications    acetaminophen 500 MG tablet Commonly known as: TYLENOL Take 2 tablets (1,000 mg total) by mouth every 6 (six) hours as needed for mild pain (pain score 1-3).   amoxicillin-clavulanate 875-125 MG tablet Commonly known as: AUGMENTIN Take 1 tablet by mouth 2 (two) times daily for 7 days.   ibuprofen 200 MG tablet Commonly known as: ADVIL Take 400 mg by mouth 2 (two) times daily as needed for headache or moderate pain (pain score 4-6).   levonorgestrel 20 MCG/24HR IUD Commonly known as: MIRENA by Intrauterine route.   lisdexamfetamine 50 MG capsule Commonly known as: VYVANSE Take 1 capsule (50 mg total) by mouth in the morning. What changed:  when to take this reasons to take this   montelukast 10 MG tablet Commonly known as: SINGULAIR Take 1 tablet by mouth once daily   Normal Saline Flush 0.9 % Soln 10 mLs by Intracatheter route daily. Flush cholecystostomy drain with 5-66mL daily.   oxyCODONE 5 MG immediate release tablet Commonly known as: Oxy IR/ROXICODONE Take 1 tablet (5 mg total) by mouth every 6 (six) hours as needed for up to 3 days for severe pain (pain score 7-10).   polyethylene glycol 17 g packet Commonly known as: MIRALAX / GLYCOLAX Take 17 g by mouth daily as needed for mild constipation or moderate constipation.   tamoxifen 10 MG tablet Commonly known as: NOLVADEX Take 1 tablet (10 mg total) by mouth daily.   VITAMIN D-3 PO Take 1 tablet by mouth daily.   Zepbound 12.5 MG/0.5ML Pen Generic drug: tirzepatide Inject 12.5 mg into the skin once a week. What changed: when to take this          Follow-up Information     Gaynelle Adu, MD. Call.   Specialty: General Surgery Why: We are  making a follow up appointment for you., Please call to confirm appointment time., Arrive 30 minutes early to complete check in, and bring photo ID and insurance card. Contact information: [address] [place] [address] [phone]         [facility] Follow up.   Why: Please follow up with IR in 6 weeks for a drain evaluation. A scheduler from our clinic will call you with the date and time of your appointment. Please call our clinic with questions or concerns prior to your visit. Contact information: [address] [place] [address] [phone]                 Signed: Clarise Cruz [facility] Surgery 09/17/2023, 11:03 AM Please see Amion for pager number during day hours 7:00am-4:30pm

## 2023-09-19 ENCOUNTER — Other Ambulatory Visit: Payer: Self-pay | Admitting: General Surgery

## 2023-09-19 DIAGNOSIS — K810 Acute cholecystitis: Secondary | ICD-10-CM

## 2023-09-20 LAB — AEROBIC/ANAEROBIC CULTURE W GRAM STAIN (SURGICAL/DEEP WOUND)
Culture: NO GROWTH
Gram Stain: NONE SEEN

## 2023-09-25 DIAGNOSIS — Z434 Encounter for attention to other artificial openings of digestive tract: Secondary | ICD-10-CM | POA: Diagnosis not present

## 2023-09-25 DIAGNOSIS — Z8632 Personal history of gestational diabetes: Secondary | ICD-10-CM | POA: Diagnosis not present

## 2023-09-25 DIAGNOSIS — Z01818 Encounter for other preprocedural examination: Secondary | ICD-10-CM | POA: Diagnosis not present

## 2023-09-25 DIAGNOSIS — K810 Acute cholecystitis: Secondary | ICD-10-CM | POA: Diagnosis not present

## 2023-09-25 DIAGNOSIS — K8001 Calculus of gallbladder with acute cholecystitis with obstruction: Secondary | ICD-10-CM | POA: Diagnosis not present

## 2023-10-09 ENCOUNTER — Other Ambulatory Visit: Payer: Self-pay

## 2023-10-09 ENCOUNTER — Other Ambulatory Visit (HOSPITAL_BASED_OUTPATIENT_CLINIC_OR_DEPARTMENT_OTHER): Payer: Self-pay

## 2023-10-09 MED ORDER — LISDEXAMFETAMINE DIMESYLATE 50 MG PO CAPS
50.0000 mg | ORAL_CAPSULE | Freq: Every morning | ORAL | 0 refills | Status: DC
  Filled 2023-10-09: qty 30, 30d supply, fill #0

## 2023-10-22 NOTE — Progress Notes (Shared)
Referring Physician(s): Dr. Andrey Campanile  Chief Complaint: The patient is seen in follow up today s/p percutaneous cholecystostomy placement 09/15/23  History of present illness: Stephanie Fields is a 43 year old female with no significant past medical history who presented to the ED 09/15/23 with epigastric and RUQ pain. Work up was positive for acute calculous cholecystitis with imaging showing the gallbladder wall to be thickened and inflamed Surgery did not deem her an appropriate surgical candidate and IR was consulted for drain placement. She was seen in IR 09/15/23 for a percutaneous cholecystostomy. She was discharged from the hospital 09/16/23 and she presents today for a drain evaluation/exchange. Surgery is considering her for possible cholecystectomy.     Past Medical History:  Diagnosis Date   Allergy    Asthma    Exercise induced   Borderline hyperlipidemia    Depression    Diabetes mellitus without complication (HCC)    Gestational   Family history of basal cell carcinoma    Family history of breast cancer    Family history of melanoma    Family history of pancreatic cancer    Family history of prostate cancer    Hypertension    Pre eclampsia   Kidney stones    Obesity     Past Surgical History:  Procedure Laterality Date   EYE SURGERY  02/06/23   IR PERC CHOLECYSTOSTOMY  09/15/2023   LASIK Bilateral 02/06/2023   TOOTH EXTRACTION      Allergies: Wellbutrin [bupropion]  Medications: Prior to Admission medications   Medication Sig Start Date End Date Taking? Authorizing Provider  acetaminophen (TYLENOL) 500 MG tablet Take 2 tablets (1,000 mg total) by mouth every 6 (six) hours as needed for mild pain (pain score 1-3). 09/16/23   Eric Form, PA-C  Cholecalciferol (VITAMIN D-3 PO) Take 1 tablet by mouth daily.    [provider]  ibuprofen (ADVIL) 200 MG tablet Take 400 mg by mouth 2 (two) times daily as needed for headache or moderate pain (pain  score 4-6).    [provider]  levonorgestrel (MIRENA) 20 MCG/24HR IUD by Intrauterine route.    [provider]  lisdexamfetamine (VYVANSE) 50 MG capsule Take 1 capsule (50 mg total) by mouth in the morning. Patient taking differently: Take 50 mg by mouth daily as needed (work days). 09/01/23     lisdexamfetamine (VYVANSE) 50 MG capsule Take 1 capsule (50 mg total) by mouth in the morning. 10/09/23     montelukast (SINGULAIR) 10 MG tablet Take 1 tablet by mouth once daily 10/04/22   Jeani Sow, MD  polyethylene glycol (MIRALAX / GLYCOLAX) 17 g packet Take 17 g by mouth daily as needed for mild constipation or moderate constipation. 09/16/23   Eric Form, PA-C  sodium chloride flush 0.9 % SOLN injection 10 mLs by Intracatheter route daily. Flush cholecystostomy drain with 5-68mL daily. 09/16/23   McInnis, Caitlyn M, PA-C  tamoxifen (NOLVADEX) 10 MG tablet Take 1 tablet (10 mg total) by mouth daily. 05/01/23   Serena Croissant, MD  tirzepatide (ZEPBOUND) 12.5 MG/0.5ML Pen Inject 12.5 mg into the skin once a week. Patient taking differently: Inject 12.5 mg into the skin every Tuesday. 08/28/23   Jeani Sow, MD     Family History  Problem Relation Age of Onset   Early death Mother    Alcohol abuse Mother    COPD Mother    Depression Mother    Diabetes Mother    Hyperlipidemia Mother  Hypertension Mother    Mental illness Mother    Hypothyroidism Mother    Anxiety disorder Mother    Obesity Mother    Depression Father    Diabetes Father    Hearing loss Father    Hyperlipidemia Father    Hypertension Father    Colon polyps Father 63   Prostate cancer Father 69       prostate cancer   Cancer Father    Obesity Father    Depression Sister    Depression Brother    Learning disabilities Daughter        Autism   Breast cancer Maternal Aunt 60       negative genetic test   Cancer Maternal Aunt    Heart disease Maternal Grandmother    Breast cancer Maternal  Grandmother 45       thyroid ca, breast ca,squamous ca, basal cell ca, melanoma   Basal cell carcinoma Maternal Grandmother    Melanoma Maternal Grandmother 92   Cancer Maternal Grandmother    Alcohol abuse Maternal Grandfather    Diabetes Maternal Grandfather    Heart attack Maternal Grandfather    Heart disease Maternal Grandfather    Hyperlipidemia Maternal Grandfather    Hypertension Maternal Grandfather    Arthritis Paternal Grandmother    Hearing loss Paternal Grandmother    Heart attack Paternal Grandmother    Heart disease Paternal Grandmother    Hyperlipidemia Paternal Grandmother    Heart disease Paternal Grandfather    Hyperlipidemia Paternal Grandfather    Alzheimer's disease Paternal Grandfather    Depression Sister    Depression Brother    Breast cancer Maternal Aunt 61   Cancer Maternal Aunt    Pancreatic cancer Paternal Uncle 53   Breast cancer Maternal Aunt 47       negative genetic test   Breast cancer Maternal Great-grandmother 8   Cancer Maternal Aunt    Cancer Paternal Uncle     Social History   Socioeconomic History   Marital status: Married    Spouse name: Not on file   Number of children: 4   Years of education: Not on file   Highest education level: Not on file  Occupational History   Occupation: cardiologist    Employer: [employer]  Tobacco Use   Smoking status: Never   Smokeless tobacco: Never  Vaping Use   Vaping status: Never Used  Substance and Sexual Activity   Alcohol use: Yes    Comment: 1-2 times per year   Drug use: Never   Sexual activity: Yes    Birth control/protection: I.U.D.    Comment: husband-vasectomy  Other Topics Concern   Not on file  Social History Narrative   Cardiologist at [employer]   Financial Resource Strain: Not on file  Food Insecurity: No Food Insecurity (09/15/2023)   Hunger Vital Sign    Worried About Running Out of Food in the Last Year: Never true    Ran Out of Food  in the Last Year: Never true  Transportation Needs: No Transportation Needs (09/15/2023)   PRAPARE - Administrator, Civil Service (Medical): No    Lack of Transportation (Non-Medical): No  Physical Activity: Not on file  Stress: Not on file  Social Connections: Not on file     Vital Signs: There were no vitals taken for this visit.  Physical Exam  Imaging: No results found.  Labs:  CBC: Recent Labs  11/20/22 0000 09/15/23 0026 09/16/23 0613  WBC 9.5 11.7* 7.1  HGB 14.7 14.8 13.1  HCT 45 43.1 39.3  PLT 238 258 209    COAGS: Recent Labs    09/15/23 0807  INR 1.1    BMP: Recent Labs    11/19/22 0000 11/20/22 0000 09/15/23 0026 09/16/23 0613  NA 144  --  143 139  K 4.6  --  3.1* 3.5  CL 108  --  105 109  CO2 18  --  26 23  GLUCOSE  --   --  116* 86  BUN 16  --  11 <5*  CALCIUM  --  9.2 9.3 8.2*  CREATININE 0.8  --  0.79 0.72  GFRNONAA  --   --  >60 >60    LIVER FUNCTION TESTS: Recent Labs    11/19/22 0000 11/20/22 0000 09/15/23 0026 09/16/23 0613  BILITOT  --   --  0.5 0.5  AST 14  --  30 22  ALT 17  --  22 23  ALKPHOS 65  --  57 42  PROT  --   --  7.0 5.5*  ALBUMIN  --  4.1 4.3 3.1*    Assessment and Plan:  43 year old female with a history of acute calculous cholecystitis. She was not considered a surgical candidate at the time of diagnosis and she received a percutaneous cholecystostomy 09/15/23.  Electronically Signed: Mickie Kay 10/22/2023, 9:42 AM   I spent a total of 25 Minutes in face to face in clinical consultation, greater than 50% of which was counseling/coordinating care for acute calculous cholecystitis.

## 2023-10-24 ENCOUNTER — Ambulatory Visit
Admission: RE | Admit: 2023-10-24 | Discharge: 2023-10-24 | Disposition: A | Discharge status: Home / Self Care | Payer: 59 | Source: Ambulatory Visit | Attending: Radiology | Admitting: Radiology

## 2023-10-24 ENCOUNTER — Ambulatory Visit
Admission: RE | Admit: 2023-10-24 | Discharge: 2023-10-24 | Disposition: A | Discharge status: Home / Self Care | Payer: 59 | Source: Ambulatory Visit | Attending: General Surgery | Admitting: General Surgery

## 2023-10-24 DIAGNOSIS — K8000 Calculus of gallbladder with acute cholecystitis without obstruction: Secondary | ICD-10-CM | POA: Diagnosis not present

## 2023-10-24 DIAGNOSIS — K810 Acute cholecystitis: Secondary | ICD-10-CM

## 2023-10-24 NOTE — Progress Notes (Signed)
 Surgery orders requested via Epic inbox.

## 2023-10-27 ENCOUNTER — Other Ambulatory Visit: Payer: 59

## 2023-10-27 ENCOUNTER — Ambulatory Visit: Payer: Self-pay | Admitting: General Surgery

## 2023-10-30 ENCOUNTER — Encounter (HOSPITAL_COMMUNITY): Payer: Self-pay

## 2023-10-30 NOTE — Patient Instructions (Addendum)
 SURGICAL WAITING ROOM VISITATION  Patients having surgery or a procedure may have no more than 2 support people in the waiting area - these visitors may rotate.    Children under the age of 58 must have an adult with them who is not the patient.  Due to an increase in RSV and influenza rates and associated hospitalizations, children ages 90 and under may not visit patients in [facility] hospitals.  Visitors with respiratory illnesses are discouraged from visiting and should remain at home.  If the patient needs to stay at the hospital during part of their recovery, the visitor guidelines for inpatient rooms apply. Pre-op nurse will coordinate an appropriate time for 1 support person to accompany patient in pre-op.  This support person may not rotate.    Please refer to the [facility] website for the visitor guidelines for Inpatients (after your surgery is over and you are in a regular room).       Your procedure is scheduled on: 11-04-23   Report to [facility] Main Entrance    Report to admitting at     0745 AM   Call this number if you have problems the morning of surgery [phone]   Do not eat food :After Midnight.   After Midnight you may have the following liquids until __0700____ AM/  DAY OF SURGERY  then nothing by mouth  Water Non-Citrus Juices (without pulp, NO RED-Apple, White grape, White cranberry) Black Coffee (NO MILK/CREAM OR CREAMERS, sugar ok)  Clear Tea (NO MILK/CREAM OR CREAMERS, sugar ok) regular and decaf                             Plain Jell-O (NO RED)                                           Fruit ices (not with fruit pulp, NO RED)                                     Popsicles (NO RED)                                                               Sports drinks like Gatorade (NO RED)                          If you have questions, please contact your surgeon's office.   FOLLOW ANY ADDITIONAL PRE OP INSTRUCTIONS YOU RECEIVED FROM YOUR  SURGEON'S OFFICE!!!     Oral Hygiene is also important to reduce your risk of infection.                                    Remember - BRUSH YOUR TEETH THE MORNING OF SURGERY WITH YOUR REGULAR TOOTHPASTE  DENTURES WILL BE REMOVED PRIOR TO SURGERY PLEASE DO NOT APPLY "Poly grip" OR ADHESIVES!!!      Stop all vitamins and herbal supplements 7 days  before surgery.   Take these medicines the morning of surgery with A SIP OF WATER: Singulair, Tamoxifen     Zepbound HOLD one week prior to surgery                            You may not have any metal on your body including hair pins, jewelry, and body piercing             Do not wear make-up, lotions, powders, perfumes/cologne, or deodorant  Do not wear nail polish including gel and S&S, artificial/acrylic nails, or any other type of covering on natural nails including finger and toenails. If you have artificial nails, gel coating, etc. that needs to be removed by a nail salon please have this removed prior to surgery or surgery may need to be canceled/ delayed if the surgeon/ anesthesia feels like they are unable to be safely monitored.   Do not shave  48 hours prior to surgery.           Do not bring valuables to the hospital. [facility] IS NOT             RESPONSIBLE   FOR VALUABLES.   Contacts, glasses, dentures or bridgework may not be worn into surgery.   Bring small overnight bag day of surgery.   DO NOT BRING YOUR HOME MEDICATIONS TO THE HOSPITAL. PHARMACY WILL DISPENSE MEDICATIONS LISTED ON YOUR MEDICATION LIST TO YOU DURING YOUR ADMISSION IN THE HOSPITAL!    Patients discharged on the day of surgery will not be allowed to drive home.  Someone NEEDS to stay with you for the first 24 hours after anesthesia.   Special Instructions: Bring a copy of your healthcare power of attorney and living will documents the day of surgery if you haven't scanned them before.              Please read over the following fact sheets you were  given: IF YOU HAVE QUESTIONS ABOUT YOUR PRE-OP INSTRUCTIONS PLEASE CALL [phone]    If you test positive for Covid or have been in contact with anyone that has tested positive in the last 10 days please notify you surgeon.    [facility] - Preparing for Surgery Before surgery, you can play an important role.  Because skin is not sterile, your skin needs to be as free of germs as possible.  You can reduce the number of germs on your skin by washing with CHG (chlorahexidine gluconate) soap before surgery.  CHG is an antiseptic cleaner which kills germs and bonds with the skin to continue killing germs even after washing. Please DO NOT use if you have an allergy to CHG or antibacterial soaps.  If your skin becomes reddened/irritated stop using the CHG and inform your nurse when you arrive at Short Stay. Do not shave (including legs and underarms) for at least 48 hours prior to the first CHG shower.  You may shave your face/neck. Please follow these instructions carefully:  1.  Shower with CHG Soap the night before surgery and the  morning of Surgery.  2.  If you choose to wash your hair, wash your hair first as usual with your  normal  shampoo.  3.  After you shampoo, rinse your hair and body thoroughly to remove the  shampoo.  4.  Use CHG as you would any other liquid soap.  You can apply chg directly  to the skin and wash                       Gently with a scrungie or clean washcloth.  5.  Apply the CHG Soap to your body ONLY FROM THE NECK DOWN.   Do not use on face/ open                           Wound or open sores. Avoid contact with eyes, ears mouth and genitals (private parts).                       Wash face,  Genitals (private parts) with your normal soap.             6.  Wash thoroughly, paying special attention to the area where your surgery  will be performed.  7.  Thoroughly rinse your body with warm water from the neck down.  8.  DO NOT shower/wash with  your normal soap after using and rinsing off  the CHG Soap.                9.  Pat yourself dry with a clean towel.            10.  Wear clean pajamas.            11.  Place clean sheets on your bed the night of your first shower and do not  sleep with pets. Day of Surgery : Do not apply any lotions/deodorants the morning of surgery.  Please wear clean clothes to the hospital/surgery center.  FAILURE TO FOLLOW THESE INSTRUCTIONS MAY RESULT IN THE CANCELLATION OF YOUR SURGERY PATIENT SIGNATURE_________________________________  NURSE SIGNATURE__________________________________  ________________________________________________________________________

## 2023-10-30 NOTE — Progress Notes (Addendum)
 PCP - Ruthine Dose, Ann,MD LOV 02-19-23 epic Cardiologist - no  PPM/ICD -  Device Orders -  Rep Notified -   Chest x-ray -  EKG - 09-17-23 epic Stress Test -  ECHO -  Cardiac Cath -   Sleep Study -  CPAP -   Fasting Blood Sugar -  Checks Blood Sugar _____ times a day  Blood Thinner Instructions: Aspirin Instructions:  ERAS Protcol - PRE-SURGERY no drink    COVID vaccine -yes  Zepbound-- Last dose- 10-11-23  Activity--Able to complete a flight of stair without CP or SOB Anesthesia review: preeclampsia, gestational DM  Patient denies shortness of breath, fever, cough and chest pain at PAT appointment   All instructions explained to the patient, with a verbal understanding of the material. Patient agrees to go over the instructions while at home for a better understanding. Patient also instructed to self quarantine after being tested for COVID-19. The opportunity to ask questions was provided.

## 2023-10-31 ENCOUNTER — Encounter (HOSPITAL_COMMUNITY)
Admission: RE | Admit: 2023-10-31 | Discharge: 2023-10-31 | Disposition: A | Discharge status: Home / Self Care | Payer: 59 | Source: Ambulatory Visit | Attending: General Surgery | Admitting: General Surgery

## 2023-10-31 ENCOUNTER — Other Ambulatory Visit: Payer: Self-pay

## 2023-10-31 ENCOUNTER — Encounter (HOSPITAL_COMMUNITY): Payer: Self-pay

## 2023-10-31 VITALS — BP 158/92 | HR 2 | Temp 98.5°F | Resp 16 | Ht 67.0 in | Wt 191.0 lb

## 2023-10-31 DIAGNOSIS — Z01818 Encounter for other preprocedural examination: Secondary | ICD-10-CM

## 2023-10-31 DIAGNOSIS — Z01812 Encounter for preprocedural laboratory examination: Secondary | ICD-10-CM | POA: Diagnosis not present

## 2023-10-31 HISTORY — DX: Personal history of urinary calculi: Z87.442

## 2023-10-31 HISTORY — DX: Headache, unspecified: R51.9

## 2023-10-31 HISTORY — DX: Attention-deficit hyperactivity disorder, unspecified type: F90.9

## 2023-10-31 LAB — BASIC METABOLIC PANEL
Anion gap: 7 (ref 5–15)
BUN: 19 mg/dL (ref 6–20)
CO2: 25 mmol/L (ref 22–32)
Calcium: 9.2 mg/dL (ref 8.9–10.3)
Chloride: 109 mmol/L (ref 98–111)
Creatinine, Ser: 0.8 mg/dL (ref 0.44–1.00)
GFR, Estimated: >60 mL/min (ref >60)
Glucose, Bld: 103 mg/dL — ABNORMAL HIGH (ref 70–99)
Potassium: 4.2 mmol/L (ref 3.5–5.1)
Sodium: 141 mmol/L (ref 135–145)

## 2023-10-31 LAB — CBC
HCT: 42.7 % (ref 36.0–46.0)
Hemoglobin: 14.1 g/dL (ref 12.0–15.0)
MCH: 28.4 pg (ref 26.0–34.0)
MCHC: 33 g/dL (ref 30.0–36.0)
MCV: 86.1 fL (ref 80.0–100.0)
Platelets: 244 10*3/uL (ref 150–400)
RBC: 4.96 MIL/uL (ref 3.87–5.11)
RDW: 11.9 % (ref 11.5–15.5)
WBC: 8.2 10*3/uL (ref 4.0–10.5)
nRBC: 0 % (ref 0.0–0.2)

## 2023-11-04 ENCOUNTER — Other Ambulatory Visit (HOSPITAL_COMMUNITY): Payer: Self-pay

## 2023-11-04 ENCOUNTER — Encounter (HOSPITAL_COMMUNITY)
Admission: RE | Disposition: A | Discharge status: Home / Self Care | Payer: Self-pay | Source: Home / Self Care | Attending: General Surgery

## 2023-11-04 ENCOUNTER — Ambulatory Visit (HOSPITAL_COMMUNITY)
Admission: RE | Admit: 2023-11-04 | Discharge: 2023-11-04 | Disposition: A | Discharge status: Home / Self Care | Payer: 59 | Attending: General Surgery | Admitting: General Surgery

## 2023-11-04 ENCOUNTER — Ambulatory Visit (HOSPITAL_BASED_OUTPATIENT_CLINIC_OR_DEPARTMENT_OTHER): Admitting: Certified Registered"

## 2023-11-04 ENCOUNTER — Other Ambulatory Visit: Payer: Self-pay

## 2023-11-04 ENCOUNTER — Encounter (HOSPITAL_COMMUNITY): Payer: Self-pay | Admitting: General Surgery

## 2023-11-04 ENCOUNTER — Ambulatory Visit (HOSPITAL_COMMUNITY): Admitting: Certified Registered"

## 2023-11-04 DIAGNOSIS — K8010 Calculus of gallbladder with chronic cholecystitis without obstruction: Secondary | ICD-10-CM | POA: Insufficient documentation

## 2023-11-04 DIAGNOSIS — I10 Essential (primary) hypertension: Secondary | ICD-10-CM | POA: Insufficient documentation

## 2023-11-04 DIAGNOSIS — F909 Attention-deficit hyperactivity disorder, unspecified type: Secondary | ICD-10-CM | POA: Insufficient documentation

## 2023-11-04 DIAGNOSIS — J45909 Unspecified asthma, uncomplicated: Secondary | ICD-10-CM | POA: Diagnosis not present

## 2023-11-04 DIAGNOSIS — R519 Headache, unspecified: Secondary | ICD-10-CM | POA: Diagnosis not present

## 2023-11-04 DIAGNOSIS — K8020 Calculus of gallbladder without cholecystitis without obstruction: Secondary | ICD-10-CM | POA: Diagnosis not present

## 2023-11-04 DIAGNOSIS — Z833 Family history of diabetes mellitus: Secondary | ICD-10-CM | POA: Insufficient documentation

## 2023-11-04 DIAGNOSIS — E119 Type 2 diabetes mellitus without complications: Secondary | ICD-10-CM | POA: Diagnosis not present

## 2023-11-04 DIAGNOSIS — K828 Other specified diseases of gallbladder: Secondary | ICD-10-CM | POA: Diagnosis not present

## 2023-11-04 DIAGNOSIS — Z87442 Personal history of urinary calculi: Secondary | ICD-10-CM | POA: Insufficient documentation

## 2023-11-04 HISTORY — PX: CHOLECYSTECTOMY: SHX55

## 2023-11-04 LAB — GLUCOSE, CAPILLARY: Glucose-Capillary: 153 mg/dL — ABNORMAL HIGH (ref 70–99)

## 2023-11-04 LAB — POCT PREGNANCY, URINE: Preg Test, Ur: NEGATIVE

## 2023-11-04 SURGERY — LAPAROSCOPIC CHOLECYSTECTOMY
Anesthesia: General

## 2023-11-04 MED ORDER — FENTANYL CITRATE PF 50 MCG/ML IJ SOSY
PREFILLED_SYRINGE | INTRAMUSCULAR | Status: AC
  Administered 2023-11-04: 50 ug via INTRAVENOUS
  Filled 2023-11-04: qty 1

## 2023-11-04 MED ORDER — 0.9 % SODIUM CHLORIDE (POUR BTL) OPTIME
TOPICAL | Status: DC | PRN
  Administered 2023-11-04: 1000 mL

## 2023-11-04 MED ORDER — MIDAZOLAM HCL 5 MG/5ML IJ SOLN
INTRAMUSCULAR | Status: DC | PRN
Start: 2023-11-04 — End: 2023-11-04
  Administered 2023-11-04: 2 mg via INTRAVENOUS

## 2023-11-04 MED ORDER — ACETAMINOPHEN 500 MG PO TABS
1000.0000 mg | ORAL_TABLET | ORAL | Status: AC
  Administered 2023-11-04: 1000 mg via ORAL
  Filled 2023-11-04: qty 2

## 2023-11-04 MED ORDER — SCOPOLAMINE 1 MG/3DAYS TD PT72
1.0000 | MEDICATED_PATCH | TRANSDERMAL | Status: DC
  Administered 2023-11-04: 1.5 mg via TRANSDERMAL
  Filled 2023-11-04: qty 1

## 2023-11-04 MED ORDER — FENTANYL CITRATE PF 50 MCG/ML IJ SOSY
PREFILLED_SYRINGE | INTRAMUSCULAR | Status: AC
  Filled 2023-11-04: qty 1

## 2023-11-04 MED ORDER — LIDOCAINE HCL (CARDIAC) PF 100 MG/5ML IV SOSY
PREFILLED_SYRINGE | INTRAVENOUS | Status: DC | PRN
Start: 2023-11-04 — End: 2023-11-04
  Administered 2023-11-04: 60 mg via INTRATRACHEAL

## 2023-11-04 MED ORDER — PROPOFOL 10 MG/ML IV BOLUS
INTRAVENOUS | Status: DC | PRN
  Administered 2023-11-04: 120 mg via INTRAVENOUS

## 2023-11-04 MED ORDER — OXYCODONE HCL 5 MG PO TABS
ORAL_TABLET | ORAL | Status: AC
  Administered 2023-11-04: 5 mg via ORAL
  Filled 2023-11-04: qty 1

## 2023-11-04 MED ORDER — OXYCODONE HCL 5 MG PO TABS
5.0000 mg | ORAL_TABLET | Freq: Four times a day (QID) | ORAL | 0 refills | Status: DC | PRN
Start: 2023-11-04 — End: 2024-04-05
  Filled 2023-11-04: qty 15, 4d supply, fill #0

## 2023-11-04 MED ORDER — OXYCODONE HCL 5 MG/5ML PO SOLN: 5.0000 mg | Freq: Once | ORAL | Status: AC | PRN

## 2023-11-04 MED ORDER — OXYCODONE HCL 5 MG PO TABS: 5.0000 mg | ORAL_TABLET | Freq: Once | ORAL | Status: AC | PRN

## 2023-11-04 MED ORDER — LIDOCAINE 20MG/ML (2%) 15 ML SYRINGE OPTIME
INTRAMUSCULAR | Status: DC | PRN
  Administered 2023-11-04: 1.5 mg/kg/h via INTRAVENOUS

## 2023-11-04 MED ORDER — ONDANSETRON HCL 4 MG/2ML IJ SOLN
INTRAMUSCULAR | Status: DC | PRN
Start: 2023-11-04 — End: 2023-11-04
  Administered 2023-11-04: 4 mg via INTRAVENOUS

## 2023-11-04 MED ORDER — ORAL CARE MOUTH RINSE: 15.0000 mL | Freq: Once | OROMUCOSAL | Status: AC

## 2023-11-04 MED ORDER — CHLORHEXIDINE GLUCONATE CLOTH 2 % EX PADS: 6.0000 | MEDICATED_PAD | Freq: Once | CUTANEOUS | Status: DC

## 2023-11-04 MED ORDER — CELECOXIB 200 MG PO CAPS
200.0000 mg | ORAL_CAPSULE | Freq: Once | ORAL | Status: AC
  Administered 2023-11-04: 200 mg via ORAL
  Filled 2023-11-04: qty 1

## 2023-11-04 MED ORDER — LACTATED RINGERS IV SOLN
INTRAVENOUS | Status: DC
Start: 2023-11-04 — End: 2023-11-04

## 2023-11-04 MED ORDER — FENTANYL CITRATE (PF) 100 MCG/2ML IJ SOLN
INTRAMUSCULAR | Status: AC
  Filled 2023-11-04: qty 2

## 2023-11-04 MED ORDER — ROCURONIUM BROMIDE 100 MG/10ML IV SOLN
INTRAVENOUS | Status: DC | PRN
  Administered 2023-11-04: 50 mg via INTRAVENOUS

## 2023-11-04 MED ORDER — ALBUMIN HUMAN 5 % IV SOLN: INTRAVENOUS | Status: DC | PRN

## 2023-11-04 MED ORDER — HYDROMORPHONE HCL 1 MG/ML IJ SOLN
INTRAMUSCULAR | Status: AC
  Filled 2023-11-04: qty 1

## 2023-11-04 MED ORDER — AMISULPRIDE (ANTIEMETIC) 5 MG/2ML IV SOLN: 10.0000 mg | Freq: Once | INTRAVENOUS | Status: DC | PRN

## 2023-11-04 MED ORDER — BUPIVACAINE-EPINEPHRINE 0.25% -1:200000 IJ SOLN
INTRAMUSCULAR | Status: DC | PRN
  Administered 2023-11-04: 30 mL

## 2023-11-04 MED ORDER — ACETAMINOPHEN 500 MG PO TABS: 1000.0000 mg | ORAL_TABLET | Freq: Four times a day (QID) | ORAL | Status: AC

## 2023-11-04 MED ORDER — KETAMINE HCL 50 MG/5ML IJ SOSY
PREFILLED_SYRINGE | INTRAMUSCULAR | Status: AC
  Filled 2023-11-04: qty 5

## 2023-11-04 MED ORDER — BUPIVACAINE-EPINEPHRINE (PF) 0.25% -1:200000 IJ SOLN
INTRAMUSCULAR | Status: AC
  Filled 2023-11-04: qty 30

## 2023-11-04 MED ORDER — CHLORHEXIDINE GLUCONATE 0.12 % MT SOLN
15.0000 mL | Freq: Once | OROMUCOSAL | Status: AC
  Administered 2023-11-04: 15 mL via OROMUCOSAL

## 2023-11-04 MED ORDER — MIDAZOLAM HCL 2 MG/2ML IJ SOLN
INTRAMUSCULAR | Status: AC
  Filled 2023-11-04: qty 2

## 2023-11-04 MED ORDER — SODIUM CHLORIDE 0.9 % IV SOLN
2.0000 g | INTRAVENOUS | Status: AC
  Administered 2023-11-04: 2 g via INTRAVENOUS
  Filled 2023-11-04: qty 2

## 2023-11-04 MED ORDER — SUGAMMADEX SODIUM 200 MG/2ML IV SOLN
INTRAVENOUS | Status: DC | PRN
Start: 2023-11-04 — End: 2023-11-04
  Administered 2023-11-04: 150 mg via INTRAVENOUS
  Administered 2023-11-04: 50 mg via INTRAVENOUS

## 2023-11-04 MED ORDER — DEXAMETHASONE SODIUM PHOSPHATE 10 MG/ML IJ SOLN
INTRAMUSCULAR | Status: DC | PRN
  Administered 2023-11-04: 8 mg via INTRAVENOUS

## 2023-11-04 MED ORDER — FENTANYL CITRATE PF 50 MCG/ML IJ SOSY
25.0000 ug | PREFILLED_SYRINGE | INTRAMUSCULAR | Status: DC | PRN
  Administered 2023-11-04: 50 ug via INTRAVENOUS

## 2023-11-04 MED ORDER — SODIUM CHLORIDE 0.9 % IV SOLN: 12.5000 mg | INTRAVENOUS | Status: DC | PRN

## 2023-11-04 MED ORDER — LACTATED RINGERS IR SOLN
Status: DC | PRN
  Administered 2023-11-04: 1000 mL

## 2023-11-04 MED ORDER — STERILE WATER FOR IRRIGATION IR SOLN
Status: DC | PRN
  Administered 2023-11-04: 1000 mL

## 2023-11-04 MED ORDER — KETAMINE HCL 10 MG/ML IJ SOLN
INTRAMUSCULAR | Status: DC | PRN
  Administered 2023-11-04: 30 mg via INTRAVENOUS

## 2023-11-04 MED ORDER — PHENYLEPHRINE 80 MCG/ML (10ML) SYRINGE FOR IV PUSH (FOR BLOOD PRESSURE SUPPORT)
PREFILLED_SYRINGE | INTRAVENOUS | Status: DC | PRN
  Administered 2023-11-04 (×2): 160 ug via INTRAVENOUS

## 2023-11-04 MED ORDER — KETOROLAC TROMETHAMINE 30 MG/ML IJ SOLN
INTRAMUSCULAR | Status: DC | PRN
  Administered 2023-11-04: 30 mg via INTRAVENOUS

## 2023-11-04 MED ORDER — HYDROMORPHONE HCL 1 MG/ML IJ SOLN
0.2500 mg | INTRAMUSCULAR | Status: DC | PRN
  Administered 2023-11-04: 0.25 mg via INTRAVENOUS

## 2023-11-04 MED ORDER — SPY AGENT GREEN - (INDOCYANINE FOR INJECTION)
1.2500 mg | Freq: Once | INTRAMUSCULAR | Status: AC
  Administered 2023-11-04: 1.25 mg via INTRAVENOUS
  Filled 2023-11-04: qty 10

## 2023-11-04 MED ORDER — FENTANYL CITRATE (PF) 100 MCG/2ML IJ SOLN
INTRAMUSCULAR | Status: DC | PRN
  Administered 2023-11-04: 100 ug via INTRAVENOUS
  Administered 2023-11-04 (×2): 25 ug via INTRAVENOUS

## 2023-11-04 SURGICAL SUPPLY — 47 items
APPLICATOR ARISTA FLEXITIP XL (MISCELLANEOUS) IMPLANT
APPLIER CLIP 5 13 M/L LIGAMAX5 (MISCELLANEOUS) ×1 IMPLANT
APPLIER CLIP ROT 10 11.4 M/L (STAPLE) IMPLANT
BAG COUNTER SPONGE SURGICOUNT (BAG) IMPLANT
CABLE HIGH FREQUENCY MONO STRZ (ELECTRODE) ×1 IMPLANT
CHLORAPREP W/TINT 26 (MISCELLANEOUS) ×1 IMPLANT
CLIP APPLIE 5 13 M/L LIGAMAX5 (MISCELLANEOUS) IMPLANT
CLIP APPLIE ROT 10 11.4 M/L (STAPLE) IMPLANT
CLIP LIGATING HEMO O LOK GREEN (MISCELLANEOUS) IMPLANT
COVER MAYO STAND XLG (MISCELLANEOUS) IMPLANT
COVER SURGICAL LIGHT HANDLE (MISCELLANEOUS) ×1 IMPLANT
DERMABOND ADVANCED .7 DNX12 (GAUZE/BANDAGES/DRESSINGS) IMPLANT
DRAPE C-ARM 42X120 X-RAY (DRAPES) IMPLANT
DRAPE SURG 17X11 SM STRL (DRAPES) IMPLANT
DRSG TEGADERM 2-3/8X2-3/4 SM (GAUZE/BANDAGES/DRESSINGS) ×3 IMPLANT
DRSG TEGADERM 4X4.75 (GAUZE/BANDAGES/DRESSINGS) ×1 IMPLANT
ELECT REM PT RETURN 15FT ADLT (MISCELLANEOUS) ×1 IMPLANT
GAUZE SPONGE 2X2 8PLY STRL LF (GAUZE/BANDAGES/DRESSINGS) ×1 IMPLANT
GLOVE BIO SURGEON STRL SZ7.5 (GLOVE) ×1 IMPLANT
GLOVE INDICATOR 8.0 STRL GRN (GLOVE) ×1 IMPLANT
GOWN STRL REUS W/ TWL XL LVL3 (GOWN DISPOSABLE) ×1 IMPLANT
GRASPER SUT TROCAR 14GX15 (MISCELLANEOUS) IMPLANT
HEMOSTAT ARISTA ABSORB 3G PWDR (HEMOSTASIS) IMPLANT
HEMOSTAT SNOW SURGICEL 2X4 (HEMOSTASIS) IMPLANT
IRRIG SUCT STRYKERFLOW 2 WTIP (MISCELLANEOUS) ×1 IMPLANT
IRRIGATION SUCT STRKRFLW 2 WTP (MISCELLANEOUS) ×1 IMPLANT
KIT BASIN OR (CUSTOM PROCEDURE TRAY) ×1 IMPLANT
KIT TURNOVER KIT A (KITS) IMPLANT
L-HOOK LAP DISP 36CM (ELECTROSURGICAL) IMPLANT
LHOOK LAP DISP 36CM (ELECTROSURGICAL) IMPLANT
POUCH RETRIEVAL ECOSAC 10 (ENDOMECHANICALS) ×1 IMPLANT
SCISSORS LAP 5X35 DISP (ENDOMECHANICALS) ×1 IMPLANT
SET CHOLANGIOGRAPH MIX (MISCELLANEOUS) IMPLANT
SET TUBE SMOKE EVAC HIGH FLOW (TUBING) ×1 IMPLANT
SLEEVE ADV FIXATION 5X100MM (TROCAR) ×1 IMPLANT
SPIKE FLUID TRANSFER (MISCELLANEOUS) ×1 IMPLANT
STRIP CLOSURE SKIN 1/2X4 (GAUZE/BANDAGES/DRESSINGS) ×1 IMPLANT
SUT MNCRL AB 4-0 PS2 18 (SUTURE) ×1 IMPLANT
SUT VIC AB 0 UR5 27 (SUTURE) IMPLANT
SUT VICRYL 0 TIES 12 18 (SUTURE) IMPLANT
SUT VICRYL 0 UR6 27IN ABS (SUTURE) IMPLANT
TOWEL OR 17X26 10 PK STRL BLUE (TOWEL DISPOSABLE) ×1 IMPLANT
TRAY LAPAROSCOPIC (CUSTOM PROCEDURE TRAY) ×1 IMPLANT
TROCAR ADV FIXATION 12X100MM (TROCAR) IMPLANT
TROCAR ADV FIXATION 5X100MM (TROCAR) ×1 IMPLANT
TROCAR BALLN 12MMX100 BLUNT (TROCAR) IMPLANT
TROCAR XCEL NON-BLD 5MMX100MML (ENDOMECHANICALS) IMPLANT

## 2023-11-04 NOTE — H&P (Signed)
 CC: here for surgery  Requesting provider: n/a  HPI: Danahi A Kelsay is an 43 y.o. female who is here for interval laparoscopic cholecystectomy with ICG dye.  The patient had been hospitalized in late January with acute cholecystitis.  Given the degree of inflammation on her admission CT I recommended management of her cholecystitis with cholecystostomy tube.  She underwent cholecystostomy tube placement on January 20.  She was discharged shortly thereafter and finished an course of oral antibiotics.  She has been doing well.  During that hospitalization we talked about the risk and benefits of cholecystectomy which were separately documented.  Since then she has been doing well.  She did have some nausea and some right sided discomfort yesterday.  She did have an outpatient study through her gallbladder drain which demonstrated a patent cystic duct and patent common bile duct, cholelithiasis.  She did seek a second opinion regarding her cholecystitis at [facility].  She wishes to proceed with interval cholecystectomy by me  Past Medical History:  Diagnosis Date   ADHD (attention deficit hyperactivity disorder)    Allergy    Asthma    Exercise induced   Borderline hyperlipidemia    Diabetes mellitus without complication (HCC)    Gestational   Family history of basal cell carcinoma    Family history of breast cancer    Family history of melanoma    Family history of pancreatic cancer    Family history of prostate cancer    Headache    History of kidney stones    Hypertension    Pre eclampsia   Obesity     Past Surgical History:  Procedure Laterality Date   IR PERC CHOLECYSTOSTOMY  09/15/2023   LASIK Bilateral 02/06/2023   TOOTH EXTRACTION      Family History  Problem Relation Age of Onset   Early death Mother    Alcohol abuse Mother    COPD Mother    Depression Mother    Diabetes Mother    Hyperlipidemia Mother    Hypertension Mother    Mental illness Mother     Hypothyroidism Mother    Anxiety disorder Mother    Obesity Mother    Depression Father    Diabetes Father    Hearing loss Father    Hyperlipidemia Father    Hypertension Father    Colon polyps Father 48   Prostate cancer Father 67       prostate cancer   Cancer Father    Obesity Father    Depression Sister    Depression Brother    Learning disabilities Daughter        Autism   Breast cancer Maternal Aunt 60       negative genetic test   Cancer Maternal Aunt    Heart disease Maternal Grandmother    Breast cancer Maternal Grandmother 32       thyroid ca, breast ca,squamous ca, basal cell ca, melanoma   Basal cell carcinoma Maternal Grandmother    Melanoma Maternal Grandmother 25   Cancer Maternal Grandmother    Alcohol abuse Maternal Grandfather    Diabetes Maternal Grandfather    Heart attack Maternal Grandfather    Heart disease Maternal Grandfather    Hyperlipidemia Maternal Grandfather    Hypertension Maternal Grandfather    Arthritis Paternal Grandmother    Hearing loss Paternal Grandmother    Heart attack Paternal Grandmother    Heart disease Paternal Grandmother    Hyperlipidemia Paternal Grandmother    Heart disease  Paternal Grandfather    Hyperlipidemia Paternal Grandfather    Alzheimer's disease Paternal Grandfather    Depression Sister    Depression Brother    Breast cancer Maternal Aunt 107   Cancer Maternal Aunt    Pancreatic cancer Paternal Uncle 60   Breast cancer Maternal Aunt 47       negative genetic test   Breast cancer Maternal Great-grandmother 29   Cancer Maternal Aunt    Cancer Paternal Uncle     Social:  reports that she has never smoked. She has never used smokeless tobacco. She reports current alcohol use. She reports that she does not use drugs.  Allergies:  Allergies  Allergen Reactions   Wellbutrin [Bupropion] Hives and Rash    Medications: I have reviewed the patient's current medications.   ROS - all of the below systems have  been reviewed with the patient and positives are indicated with bold text General: chills, fever or night sweats Eyes: blurry vision or double vision ENT: epistaxis or sore throat Allergy/Immunology: itchy/watery eyes or nasal congestion Hematologic/Lymphatic: bleeding problems, blood clots or swollen lymph nodes Endocrine: temperature intolerance or unexpected weight changes Breast: new or changing breast lumps or nipple discharge Resp: cough, shortness of breath, or wheezing CV: chest pain or dyspnea on exertion GI: as per HPI GU: dysuria, trouble voiding, or hematuria MSK: joint pain or joint stiffness Neuro: TIA or stroke symptoms Derm: pruritus and skin lesion changes Psych: anxiety and depression  PE Blood pressure 126/73, pulse 78, temperature 98.7 F (37.1 C), temperature source Oral, resp. rate 16, height 5\' 7"  (1.702 m), weight 86.6 kg, last menstrual period 10/31/2023, SpO2 98%. Constitutional: NAD; conversant; no deformities Eyes: Moist conjunctiva; no lid lag; anicteric; PERRL Neck: Trachea midline; no thyromegaly Lungs: Normal respiratory effort;  CV: RRR; no palpable thrills; no pitting edema GI: Abd soft, nt, gb drain RUQ; no palpable hepatosplenomegaly MSK:  no clubbing/cyanosis Psychiatric: Appropriate affect; alert and oriented x3 Lymphatic: No palpable cervical or axillary lymphadenopathy Skin:no rash/jaundice  Results for orders placed or performed during the hospital encounter of 11/04/23 (from the past 48 hours)  Pregnancy, urine POC     Status: None   Collection Time: 11/04/23  8:12 AM  Result Value Ref Range   Preg Test, Ur NEGATIVE NEGATIVE    Comment:        THE SENSITIVITY OF THIS METHODOLOGY IS >24 mIU/mL     No results found.  Imaging: Ct and IR cholangiogram  A/P: Gerre A Mcelmurry is an 43 y.o. female with  History of acute calculus cholecystitis status post cholecystostomy tube placement January 20  Here for interval  laparoscopic cholecystectomy with ICG dye We discussed the steps of the procedure with the patient this morning.  Discussed again the potential for subtotal/fenestrated cholecystectomy with potential drain placement and potential issues of postoperative bile leak.  Did discuss again the risk for bile duct injury.  Discussed the potential need for overnight stay.  Offered to rediscuss additional risk of surgery but she declined.  We did discuss risk of surgery during her index hospitalization back in surgery.  All of her questions were asked and answered.    Mary Sella. Andrey Campanile, MD, FACS General, Bariatric, & Minimally Invasive Surgery [facility]

## 2023-11-04 NOTE — Anesthesia Preprocedure Evaluation (Addendum)
 Anesthesia Evaluation  Patient identified by MRN, date of birth, ID band Patient awake    Reviewed: Allergy & Precautions, NPO status , Patient's Chart, lab work & pertinent test results  History of Anesthesia Complications Negative for: history of anesthetic complications  Airway Mallampati: II  TM Distance: >3 FB Neck ROM: Full    Dental  (+) Dental Advisory Given, Teeth Intact   Pulmonary asthma    Pulmonary exam normal        Cardiovascular Normal cardiovascular exam     Neuro/Psych  Headaches  negative psych ROS   GI/Hepatic negative GI ROS, Neg liver ROS,,,  Endo/Other  negative endocrine ROS    Renal/GU negative Renal ROS     Musculoskeletal negative musculoskeletal ROS (+)    Abdominal   Peds  (+) ADHD Hematology negative hematology ROS (+)   Anesthesia Other Findings On GLP-1a   Reproductive/Obstetrics  Hx Pre-E and gDM                                Anesthesia Physical Anesthesia Plan  ASA: 2  Anesthesia Plan: General   Post-op Pain Management: Tylenol PO (pre-op)* and Celebrex PO (pre-op)*   Induction: Intravenous  PONV Risk Score and Plan: 3 and Treatment may vary due to age or medical condition, Ondansetron, Dexamethasone, Midazolam and Scopolamine patch - Pre-op  Airway Management Planned: Oral ETT  Additional Equipment: None  Intra-op Plan:   Post-operative Plan: Extubation in OR  Informed Consent: I have reviewed the patients History and Physical, chart, labs and discussed the procedure including the risks, benefits and alternatives for the proposed anesthesia with the patient or authorized representative who has indicated his/her understanding and acceptance.     Dental advisory given  Plan Discussed with: CRNA and Anesthesiologist  Anesthesia Plan Comments:        Anesthesia Quick Evaluation

## 2023-11-04 NOTE — Discharge Instructions (Signed)

## 2023-11-04 NOTE — Op Note (Signed)
 Stephanie Fields [id number] 06-11-1981 11/04/2023  Laparoscopic Cholecystectomy with near infrared fluorescent cholangiography procedure Note  Indications: This patient presents with symptomatic gallbladder disease and will undergo laparoscopic cholecystectomy.  Patient had presented back in January with acute calculus cholecystitis.  Because of the inflammation on CT scan I recommended cholecystostomy tube placement.  The patient has recovered.  She had an outpatient study through her cholecystostomy tube which showed a patent cystic duct and common bile duct and she presented today for elective interval laparoscopic cholecystectomy  Pre-operative Diagnosis: Symptomatic cholelithiasis, history of acute calculus cholecystitis with cholecystostomy tube placement  Post-operative Diagnosis: Same  Surgeon: Gaynelle Adu MD FACS  Assistants: Darnell Level MD FACS (a qualified assistant was requested due to the anticipated inflammation and need for additional assistance in identifying anatomy and tissue retraction)  Anesthesia: General endotracheal anesthesia  Procedure Details  The patient was seen again in the Holding Room. The risks, benefits, complications, treatment options, and expected outcomes were discussed with the patient. The possibilities of reaction to medication, pulmonary aspiration, perforation of viscus, bleeding, recurrent infection, finding a normal gallbladder, the need for additional procedures, failure to diagnose a condition, the possible need to convert to an open procedure, and creating a complication requiring transfusion or operation were discussed with the patient. The likelihood of improving the patient's symptoms with return to their baseline status is good.  The patient and/or family concurred with the proposed plan, giving informed consent. The site of surgery properly noted. The patient was taken to Operating Room, identified as Stephanie Fields and the  procedure verified as Laparoscopic Cholecystectomy with ICG dye.  A Time Out was held and the above information confirmed. Antibiotic prophylaxis was administered.    ICG dye was administered preoperatively.    General endotracheal anesthesia was then administered and tolerated well. After the induction, the abdomen was prepped with Chloraprep and draped in the sterile fashion. The patient was positioned in the supine position.  Local anesthetic agent was injected into the skin near the umbilicus and an incision made. We dissected down to the abdominal fascia with blunt dissection.  The fascia was incised vertically and we entered the peritoneal cavity bluntly.  A pursestring suture of 0-Vicryl was placed around the fascial opening.  The Hasson cannula was inserted and secured with the stay suture.  Pneumoperitoneum was then created with CO2 and tolerated well without any adverse changes in the patient's vital signs. An 5-mm port was placed in the subxiphoid position.  Two 5-mm ports were placed in the right upper quadrant. All skin incisions were infiltrated with a local anesthetic agent before making the incision and placing the trocars.   We positioned the patient in reverse Trendelenburg, tilted slightly to the patient's left.  There was omentum going over the gallbladder on top of the liver around the cholecystostomy tube.  Using EndoShears with electrocautery I took down the omentum around the cholecystostomy tube.  The omentum then fell away.  The gallbladder was identified, the fundus grasped and retracted cephalad.  The gallbladder did not have any evidence of dense woody inflammation.  Adhesions were lysed bluntly and with the electrocautery where indicated, taking care not to injure any adjacent organs or viscus. The infundibulum was grasped and retracted laterally, exposing the peritoneum overlying the triangle of Calot. This was then divided and exposed in a blunt fashion. A critical view of the  cystic duct and cystic artery was obtained.  The cystic duct was clearly identified and bluntly dissected  circumferentially.  Utilizing the Stryker camera system near infrared fluorescent activity was visualized in the liver, cystic duct, common hepatic duct and common bile duct and small bowel.  This served as a secondary confirmation of our anatomy.  The cystic duct was then ligated with clips (3 clips on patient's side) and divided. The cystic artery which had been identified & dissected free was ligated with clips and divided as well.   The gallbladder was dissected from the liver bed in retrograde fashion with the electrocautery.  There was a little bit of spillage of bile from the posterior wall of the gallbladder as we mobilized it off of the liver.  There was no stone spillage.  the gallbladder was removed and placed in an Ecco sac.  The gallbladder and Ecco sac were then removed through the umbilical port site. The liver bed was irrigated and inspected. Hemostasis was achieved with the electrocautery. Copious irrigation was utilized and was repeatedly aspirated until clear.  There was no evidence of bile leakage when we reactivated Stryker near infrared camera system.  We confirmed that the distal part of the cholecystostomy tube was within the specimen.  The pursestring suture was used to close the umbilical fascia.  I placed an additional interrupted 0 Vicryl at the umbilical fascia using PMI suture passer with laparoscopic guidance.  We again inspected the right upper quadrant for hemostasis.  The umbilical closure was inspected and there was no air leak and nothing trapped within the closure.  The external cholecystostomy tube was removed.  Pneumoperitoneum was released as we removed the trocars.  4-0 Monocryl was used to close the skin.   Dermabond was applied.  2 x 2 and Tegaderm was placed over the old cholecystostomy tube drain site.  The patient was then extubated and brought to the recovery  room in stable condition. Instrument, sponge, and needle counts were correct at closure and at the conclusion of the case.   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         Disposition: PACU - hemodynamically stable.         Condition: stable  Mary Sella. Andrey Campanile, MD, FACS General, Bariatric, & Minimally Invasive Surgery [facility],  [facility]

## 2023-11-04 NOTE — Anesthesia Postprocedure Evaluation (Signed)
 Anesthesia Post Note  Patient: Stephanie Fields  Procedure(s) Performed: LAPAROSCOPIC CHOLECYSTECTOMY WITH ICG DYE     Patient location during evaluation: PACU Anesthesia Type: General Level of consciousness: awake and alert Pain management: pain level controlled Vital Signs Assessment: post-procedure vital signs reviewed and stable Respiratory status: spontaneous breathing, nonlabored ventilation and respiratory function stable Cardiovascular status: stable and blood pressure returned to baseline Anesthetic complications: no   No notable events documented.  Last Vitals:  Vitals:   11/04/23 1213 11/04/23 1226  BP: 117/80 117/80  Pulse: 73 74  Resp: 18 20  Temp: 36.5 C 36.5 C  SpO2: 100% 100%    Last Pain:  Vitals:   11/04/23 1226  TempSrc: Oral  PainSc: 3                  Beryle Lathe

## 2023-11-04 NOTE — Transfer of Care (Signed)
 Immediate Anesthesia Transfer of Care Note  Patient: Stephanie Fields  Procedure(s) Performed: Procedure(s): LAPAROSCOPIC CHOLECYSTECTOMY WITH ICG DYE (N/A)  Patient Location: PACU  Anesthesia Type:General  Level of Consciousness: Patient easily awoken, comfortable, cooperative, following commands, responds to stimulation.   Airway & Oxygen Therapy: Patient spontaneously breathing, ventilating well, oxygen via simple oxygen mask.  Post-op Assessment: Report given to PACU RN, vital signs reviewed and stable, moving all extremities.   Post vital signs: Reviewed and stable.  Complications: No apparent anesthesia complications  Last Vitals:  Vitals Value Taken Time  BP 136/75 11/04/23 1101  Temp    Pulse 82 11/04/23 1103  Resp 23 11/04/23 1103  SpO2 98 % 11/04/23 1103  Vitals shown include unfiled device data.  Last Pain:  Vitals:   11/04/23 0823  TempSrc:   PainSc: 5          Complications: No notable events documented.

## 2023-11-04 NOTE — Anesthesia Procedure Notes (Signed)
 Procedure Name: Intubation Date/Time: 11/04/2023 9:38 AM  Performed by: Donna Bernard, CRNAPre-anesthesia Checklist: Patient identified, Emergency Drugs available, Suction available, Patient being monitored and Timeout performed Patient Re-evaluated:Patient Re-evaluated prior to induction Oxygen Delivery Method: Circle system utilized Preoxygenation: Pre-oxygenation with 100% oxygen Induction Type: IV induction Ventilation: Mask ventilation without difficulty Laryngoscope Size: Miller and 2 Grade View: Grade II Tube type: Oral Tube size: 7.0 mm Number of attempts: 1 Airway Equipment and Method: Stylet Placement Confirmation: positive ETCO2, ETT inserted through vocal cords under direct vision, CO2 detector and breath sounds checked- equal and bilateral Secured at: 22 cm Tube secured with: Tape Dental Injury: Teeth and Oropharynx as per pre-operative assessment

## 2023-11-05 ENCOUNTER — Other Ambulatory Visit (HOSPITAL_BASED_OUTPATIENT_CLINIC_OR_DEPARTMENT_OTHER): Payer: Self-pay

## 2023-11-05 ENCOUNTER — Encounter (HOSPITAL_COMMUNITY): Payer: Self-pay | Admitting: General Surgery

## 2023-11-05 LAB — SURGICAL PATHOLOGY

## 2023-11-20 ENCOUNTER — Other Ambulatory Visit (HOSPITAL_BASED_OUTPATIENT_CLINIC_OR_DEPARTMENT_OTHER): Payer: Self-pay

## 2023-11-20 MED ORDER — LISDEXAMFETAMINE DIMESYLATE 50 MG PO CAPS
50.0000 mg | ORAL_CAPSULE | Freq: Every morning | ORAL | 0 refills | Status: DC
  Filled 2023-11-20: qty 30, 30d supply, fill #0

## 2023-12-03 ENCOUNTER — Other Ambulatory Visit: Payer: Self-pay | Admitting: Family Medicine

## 2023-12-03 ENCOUNTER — Encounter: Payer: Self-pay | Admitting: Family Medicine

## 2023-12-03 ENCOUNTER — Other Ambulatory Visit (HOSPITAL_BASED_OUTPATIENT_CLINIC_OR_DEPARTMENT_OTHER): Payer: Self-pay

## 2023-12-03 MED ORDER — ZEPBOUND 15 MG/0.5ML ~~LOC~~ SOAJ
15.0000 mg | SUBCUTANEOUS | 3 refills | Status: AC
Start: 2023-12-03 — End: ?
  Filled 2023-12-03: qty 2, 28d supply, fill #0
  Filled 2024-01-31: qty 2, 28d supply, fill #1
  Filled 2024-03-24: qty 2, 28d supply, fill #2

## 2023-12-22 ENCOUNTER — Other Ambulatory Visit: Payer: Self-pay | Admitting: Family Medicine

## 2023-12-22 MED ORDER — MONTELUKAST SODIUM 10 MG PO TABS
10.0000 mg | ORAL_TABLET | Freq: Every day | ORAL | 3 refills | Status: AC
  Filled 2023-12-22: qty 90, 90d supply, fill #0
  Filled 2024-03-24: qty 90, 90d supply, fill #1
  Filled 2024-07-06: qty 90, 90d supply, fill #2
  Filled 2024-09-28: qty 90, 90d supply, fill #3

## 2023-12-23 ENCOUNTER — Other Ambulatory Visit (HOSPITAL_BASED_OUTPATIENT_CLINIC_OR_DEPARTMENT_OTHER): Payer: Self-pay

## 2024-02-02 ENCOUNTER — Other Ambulatory Visit (HOSPITAL_BASED_OUTPATIENT_CLINIC_OR_DEPARTMENT_OTHER): Payer: Self-pay

## 2024-03-06 ENCOUNTER — Other Ambulatory Visit (HOSPITAL_BASED_OUTPATIENT_CLINIC_OR_DEPARTMENT_OTHER): Payer: Self-pay

## 2024-03-06 MED ORDER — LISDEXAMFETAMINE DIMESYLATE 50 MG PO CAPS
50.0000 mg | ORAL_CAPSULE | Freq: Every morning | ORAL | 0 refills | Status: DC
  Filled 2024-03-06: qty 30, 30d supply, fill #0

## 2024-03-09 ENCOUNTER — Other Ambulatory Visit (HOSPITAL_BASED_OUTPATIENT_CLINIC_OR_DEPARTMENT_OTHER): Payer: Self-pay

## 2024-03-24 ENCOUNTER — Other Ambulatory Visit (HOSPITAL_BASED_OUTPATIENT_CLINIC_OR_DEPARTMENT_OTHER): Payer: Self-pay

## 2024-04-05 ENCOUNTER — Ambulatory Visit: Admitting: Family Medicine

## 2024-04-05 VITALS — BP 122/87 | HR 86 | Temp 97.9°F | Resp 16 | Ht 67.0 in | Wt 174.1 lb

## 2024-04-05 DIAGNOSIS — Z0000 Encounter for general adult medical examination without abnormal findings: Secondary | ICD-10-CM

## 2024-04-05 DIAGNOSIS — Z1159 Encounter for screening for other viral diseases: Secondary | ICD-10-CM | POA: Diagnosis not present

## 2024-04-05 DIAGNOSIS — Z803 Family history of malignant neoplasm of breast: Secondary | ICD-10-CM

## 2024-04-05 DIAGNOSIS — D171 Benign lipomatous neoplasm of skin and subcutaneous tissue of trunk: Secondary | ICD-10-CM | POA: Diagnosis not present

## 2024-04-05 LAB — LIPID PANEL
Cholesterol: 199 mg/dL (ref 0–200)
HDL: 37.3 mg/dL — ABNORMAL LOW (ref >39.00)
LDL Cholesterol: 136 mg/dL — ABNORMAL HIGH (ref 0–99)
NonHDL: 161.73
Total CHOL/HDL Ratio: 5
Triglycerides: 130 mg/dL (ref 0.0–149.0)
VLDL: 26 mg/dL (ref 0.0–40.0)

## 2024-04-05 LAB — CBC WITH DIFFERENTIAL/PLATELET
Basophils Absolute: 0.1 K/uL (ref 0.0–0.1)
Basophils Relative: 0.8 % (ref 0.0–3.0)
Eosinophils Absolute: 0.1 K/uL (ref 0.0–0.7)
Eosinophils Relative: 1.5 % (ref 0.0–5.0)
HCT: 43.2 % (ref 36.0–46.0)
Hemoglobin: 14.3 g/dL (ref 12.0–15.0)
Lymphocytes Relative: 37.9 % (ref 12.0–46.0)
Lymphs Abs: 3 K/uL (ref 0.7–4.0)
MCHC: 33.2 g/dL (ref 30.0–36.0)
MCV: 85.1 fl (ref 78.0–100.0)
Monocytes Absolute: 0.4 K/uL (ref 0.1–1.0)
Monocytes Relative: 4.7 % (ref 3.0–12.0)
Neutro Abs: 4.3 K/uL (ref 1.4–7.7)
Neutrophils Relative %: 55.1 % (ref 43.0–77.0)
Platelets: 202 K/uL (ref 150.0–400.0)
RBC: 5.07 Mil/uL (ref 3.87–5.11)
RDW: 13.5 % (ref 11.5–15.5)
WBC: 7.9 K/uL (ref 4.0–10.5)

## 2024-04-05 LAB — COMPREHENSIVE METABOLIC PANEL WITH GFR
ALT: 20 U/L (ref 0–35)
AST: 20 U/L (ref 0–37)
Albumin: 4.2 g/dL (ref 3.5–5.2)
Alkaline Phosphatase: 38 U/L — ABNORMAL LOW (ref 39–117)
BUN: 8 mg/dL (ref 6–23)
CO2: 28 meq/L (ref 19–32)
Calcium: 9 mg/dL (ref 8.4–10.5)
Chloride: 106 meq/L (ref 96–112)
Creatinine, Ser: 0.72 mg/dL (ref 0.40–1.20)
GFR: 101.05 mL/min (ref >60.00)
Glucose, Bld: 85 mg/dL (ref 70–99)
Potassium: 4 meq/L (ref 3.5–5.1)
Sodium: 142 meq/L (ref 135–145)
Total Bilirubin: 0.6 mg/dL (ref 0.2–1.2)
Total Protein: 6.6 g/dL (ref 6.0–8.3)

## 2024-04-05 LAB — TSH: TSH: 1.29 u[IU]/mL (ref 0.35–5.50)

## 2024-04-05 LAB — HEMOGLOBIN A1C: Hgb A1c MFr Bld: 5.2 % (ref 4.6–6.5)

## 2024-04-05 NOTE — Patient Instructions (Signed)

## 2024-04-05 NOTE — Progress Notes (Signed)
 Phone [phone]   Subjective:   Patient is a 43 y.o. female presenting for annual physical.    Chief Complaint  Patient presents with   Annual Exam    CPE Fasting Lump on back   CPE-some exercise.   Discussed the use of AI scribe software for clinical note transcription with the patient, who gave verbal consent to proceed.  History of Present Illness Stephanie Fields is a 43 year old female who presents with gastrointestinal symptoms following gallbladder surgery.  She has been experiencing ongoing gastrointestinal issues since her gallbladder surgery, characterized by alternating constipation and extreme diarrhea. These symptoms are unpredictable, and she has not identified a consistent pattern with her current medication, Zepbound . She cannot eat a lot of food, particularly vegetables, which she used to consume regularly, as they now cause significant discomfort. Her diet currently consists of very plain foods like chicken, bread, or rice. She has tried Metamucil to regulate her bowel movements, but it did not seem to have a significant effect. She is focusing on staying hydrated. During a recent two-week trip to [place] , her symptoms were better, which may possibly to stress-related IBS. Despite the gastrointestinal issues, she reports no pain associated with these symptoms.  She mentions a lump on her L back that has been present since before her gallbladder surgery earlier this year. The lump has grown to the point where it causes numbness in her arm when she lays on her left side or applies pressure. She describes the lump as 'rubbery' and not hard, but it is becoming painful and affecting her daily life.  She is currently on tamoxifen , which she is finishing as this is her five-year mark. No headaches, dizziness, passing out, chest pain, shortness of breath, coughing, wheezing, thoughts of suicide, depression, or anxiety. Her Pap smear is up to date, and she is scheduling a  mammogram. She is due for a colonoscopy this year, as it has been five years since her last one.    See problem oriented charting- ROS- ROS: Gen: no fever, chills  Skin: no rash, itching ENT: no ear pain, ear drainage, nasal congestion, rhinorrhea, sinus pressure, sore throat Eyes: no blurry vision, double vision Resp: no cough, wheeze,SOB CV: no CP, palpitations, LE edema,  GI:HPI GU: no dysuria, urgency, frequency, hematuria MSK: no joint pain, myalgias, back pain Neuro: no dizziness, headache, weakness, vertigo Psych: no depression, anxiety, insomnia, SI   The following were reviewed and entered/updated in epic: Past Medical History:  Diagnosis Date   ADHD (attention deficit hyperactivity disorder)    Allergy    Asthma    Exercise induced   Borderline hyperlipidemia    Diabetes mellitus without complication (HCC)    Gestational   Family history of basal cell carcinoma    Family history of breast cancer    Family history of melanoma    Family history of pancreatic cancer    Family history of prostate cancer    Headache    History of kidney stones    Hypertension    Pre eclampsia   Obesity    Patient Active Problem List   Diagnosis Date Noted   Acute cholecystitis 09/15/2023   Non-seasonal allergic rhinitis 10/03/2021   At high risk for breast cancer 08/23/2019   Genetic testing 07/21/2019   Family history of prostate cancer    Family history of pancreatic cancer    Family history of melanoma    Family history of basal cell carcinoma    Vitamin  D deficiency 05/17/2019   IUD (intrauterine device) in place 05/01/2018   Depressed mood 05/07/2017   Family history of breast cancer 10/25/2015   Personal history of asthma 10/25/2015   Family history of polyps in the colon 10/25/2015   History of gestational diabetes mellitus 10/25/2015   Past Surgical History:  Procedure Laterality Date   CHOLECYSTECTOMY N/A 11/04/2023   Procedure: LAPAROSCOPIC CHOLECYSTECTOMY WITH  ICG DYE;  Surgeon: Tanda Locus, MD;  Location: [facility];  Service: General;  Laterality: N/A;   IR PERC CHOLECYSTOSTOMY  09/15/2023   LASIK Bilateral 02/06/2023   TOOTH EXTRACTION      Family History  Problem Relation Age of Onset   Early death Mother    Alcohol abuse Mother    COPD Mother    Depression Mother    Diabetes Mother    Hyperlipidemia Mother    Hypertension Mother    Mental illness Mother    Hypothyroidism Mother    Anxiety disorder Mother    Obesity Mother    Depression Father    Diabetes Father    Hearing loss Father    Hyperlipidemia Father    Hypertension Father    Colon polyps Father 41   Prostate cancer Father 59       prostate cancer   Cancer Father    Obesity Father    Depression Sister    Depression Brother    Learning disabilities Daughter        Autism   ADD / ADHD Daughter    Breast cancer Maternal Aunt 60       negative genetic test   Cancer Maternal Aunt    Heart disease Maternal Grandmother    Breast cancer Maternal Grandmother 27       thyroid  ca, breast ca,squamous ca, basal cell ca, melanoma   Basal cell carcinoma Maternal Grandmother    Melanoma Maternal Grandmother 92   Cancer Maternal Grandmother    Alcohol abuse Maternal Grandfather    Diabetes Maternal Grandfather    Heart attack Maternal Grandfather    Heart disease Maternal Grandfather    Hyperlipidemia Maternal Grandfather    Hypertension Maternal Grandfather    Arthritis Paternal Grandmother    Hearing loss Paternal Grandmother    Heart attack Paternal Grandmother    Heart disease Paternal Grandmother    Hyperlipidemia Paternal Grandmother    Heart disease Paternal Grandfather    Hyperlipidemia Paternal Grandfather    Alzheimer's disease Paternal Grandfather    Depression Sister    Depression Brother    Breast cancer Maternal Aunt 61   Cancer Maternal Aunt    Pancreatic cancer Paternal Uncle 20   Breast cancer Maternal Aunt 47       negative genetic test   Breast  cancer Maternal Great-grandmother 90   Cancer Maternal Aunt    Cancer Paternal Uncle     Medications- reviewed and updated Current Outpatient Medications  Medication Sig Dispense Refill   Cholecalciferol (VITAMIN D -3 PO) Take 1 tablet by mouth daily.     levonorgestrel  (MIRENA ) 20 MCG/24HR IUD by Intrauterine route.     lisdexamfetamine (VYVANSE ) 50 MG capsule Take 1 capsule (50 mg total) by mouth in the morning. 30 capsule 0   montelukast  (SINGULAIR ) 10 MG tablet Take 1 tablet (10 mg total) by mouth daily. 90 tablet 3   tamoxifen  (NOLVADEX ) 10 MG tablet Take 1 tablet (10 mg total) by mouth daily. 90 tablet 3   tirzepatide  (ZEPBOUND ) 15 MG/0.5ML Pen Inject 15 mg  into the skin once a week. 2 mL 3   No current facility-administered medications for this visit.    Allergies-reviewed and updated Allergies  Allergen Reactions   Wellbutrin [Bupropion] Hives and Rash    Social History   Social History Agricultural consultant at [employer]   Objective  Objective:  BP 122/87   Pulse 86   Temp 97.9 F (36.6 C) (Temporal)   Resp 16   Ht 5' 7 (1.702 m)   Wt 174 lb 2 oz (79 kg)   LMP 03/22/2024 (Approximate)   SpO2 100%   BMI 27.27 kg/m  Physical Exam  Gen: WDWN NAD HEENT: NCAT, conjunctiva not injected, sclera nonicteric TM WNL B, OP moist, no exudates  NECK:  supple, no thyromegaly, no nodes, no carotid bruits CARDIAC: RRR, S1S2+, no murmur. DP 2+B LUNGS: CTAB. No wheezes ABDOMEN:  BS+, soft, NTND, No HSM, no masses EXT:  no edema MSK: no gross abnormalities. MS 5/5 all 4 NEURO: A&O x3.  CN II-XII intact.  PSYCH: normal mood. Good eye contact   Approx 6cm, mobile mass L posterior shoulder behind axilla    Assessment and Plan   Health Maintenance counseling: 1. Anticipatory guidance: Patient counseled regarding regular dental exams q6 months, eye exams,  avoiding smoking and second hand smoke, limiting alcohol to 1 beverage per day, no illicit drugs.   2. Risk  factor reduction:  Advised patient of need for regular exercise and diet rich and fruits and vegetables to reduce risk of heart attack and stroke. Exercise- +.  Wt Readings from Last 3 Encounters:  04/05/24 174 lb 2 oz (79 kg)  11/04/23 191 lb (86.6 kg)  10/31/23 191 lb (86.6 kg)   3. Immunizations/screenings/ancillary studies Immunization History  Administered Date(s) Administered   Influenza,inj,Quad PF,6+ Mos 05/14/2019   Influenza-Unspecified 05/12/2014, 05/16/2015, 05/22/2016, 05/15/2017, 06/09/2021   PFIZER(Purple Top)SARS-COV-2 Vaccination 08/15/2019   Td 11/05/2019   Tdap 10/24/2012   Health Maintenance Due  Topic Date Due   Hepatitis C Screening  Never done   Hepatitis B Vaccines (1 of 3 - 19+ 3-dose series) Never done   HPV VACCINES (1 - 3-dose SCDM series) Never done   INFLUENZA VACCINE  03/26/2024   Colonoscopy  07/14/2024    4. Cervical cancer screening- due 07/25/26 5. Breast cancer screening-  mammogram due 6. Colon cancer screening - due 07/14/24-pt will sch 7. Skin cancer screening- advised regular sunscreen use. Denies worrisome, changing, or new skin lesions.  8. Birth control/STD check- IUD 9. Osteoporosis screening- n/a 10. Smoking associated screening - non smoker  Wellness examination -     CBC with Differential/Platelet -     Comprehensive metabolic panel with GFR -     Lipid panel -     TSH -     Hemoglobin A1c  Lipoma of torso  Screening for viral disease -     Hepatitis C antibody   Wellness-anticipatory guidance.  Work on Diet/Exercise  Check CBC,CMP,lipids,TSH, A1C.  F/u 1 yr  Assessment and Plan Assessment & Plan Postcholecystectomy gastrointestinal symptoms   She experiences postcholecystectomy gastrointestinal symptoms with alternating constipation and diarrhea, which are unpredictable and not consistently managed with the current Zepbound  dosage. Difficulty tolerating vegetables and greasy foods limits her diet. A trial of Metamucil did  not significantly improve symptoms. Stress may exacerbate symptoms, suggesting stress-induced IBS. No significant nausea is reported. Continue the current Zepbound  dosage as reducing it may not improve symptoms. Encourage dietary modifications focusing on well-tolerated foods  and advise maintaining hydration to manage symptoms.  Obesity, currently managed with Zepbound    Obesity is managed with Zepbound , resulting in gradual weight loss of less than a pound per week, approaching a plateau. She is increasing physical activity and muscle mass to aid weight management. No significant side effects from Zepbound , such as nausea, are reported. Continue Zepbound  at the current dosage and encourage physical activity, including free weights, to increase muscle mass and support weight management.  Back mass with associated numbness and pain   A large, rubbery back mass present since before gallbladder surgery causes arm numbness and pain, especially when lying on the left side or sitting straight. The differential includes a lipoma, but the size and symptoms warrant further evaluation. Refer to surgery for evaluation and potential removal of the back mass.  Family History of breast cancer, completed tamoxifen  therapy       Recommended follow up: Return in about 1 year (around 04/05/2025) for annual physical.  Lab/Order associations:+ fasting  Jenkins CHRISTELLA Carrel, MD

## 2024-04-06 ENCOUNTER — Ambulatory Visit: Payer: Self-pay | Admitting: Family Medicine

## 2024-04-06 LAB — HEPATITIS C ANTIBODY: Hepatitis C Ab: NONREACTIVE

## 2024-04-06 NOTE — Progress Notes (Signed)
 Labs look ok.  Alk phos may be slightly low normally or D may be slightly low-take D 1000 otc Your cholesterol levels are elevated.  Work on low cholesterol and lower carbs/sugars diet and  get exercise to try to lower your cholesterol.

## 2024-04-09 ENCOUNTER — Ambulatory Visit (INDEPENDENT_AMBULATORY_CARE_PROVIDER_SITE_OTHER): Admitting: Orthopaedic Surgery

## 2024-04-09 ENCOUNTER — Encounter (HOSPITAL_BASED_OUTPATIENT_CLINIC_OR_DEPARTMENT_OTHER): Payer: Self-pay

## 2024-04-09 DIAGNOSIS — D1722 Benign lipomatous neoplasm of skin and subcutaneous tissue of left arm: Secondary | ICD-10-CM

## 2024-04-09 NOTE — Progress Notes (Signed)
 Chief Complaint: Lipoma     History of Present Illness:    Stephanie Fields is a 43 y.o. female presents with a left posterior shoulder mass which has been increasing over the last several months.  This has become irritating and she is getting numbness and tingling down the left arm.  She presents today as she is seeking removal of this.  There has not been any type of change in size dramatically however.    PMH/PSH/Family History/Social History/Meds/Allergies:    Past Medical History:  Diagnosis Date   ADHD (attention deficit hyperactivity disorder)    Allergy    Asthma    Exercise induced   Borderline hyperlipidemia    Diabetes mellitus without complication (HCC)    Gestational   Family history of basal cell carcinoma    Family history of breast cancer    Family history of melanoma    Family history of pancreatic cancer    Family history of prostate cancer    Headache    History of kidney stones    Hypertension    Pre eclampsia   Obesity    Past Surgical History:  Procedure Laterality Date   CHOLECYSTECTOMY N/A 11/04/2023   Procedure: LAPAROSCOPIC CHOLECYSTECTOMY WITH ICG DYE;  Surgeon: Tanda Locus, MD;  Location: [facility];  Service: General;  Laterality: N/A;   IR PERC CHOLECYSTOSTOMY  09/15/2023   LASIK Bilateral 02/06/2023   TOOTH EXTRACTION     Social History   Socioeconomic History   Marital status: Married    Spouse name: Not on file   Number of children: 4   Years of education: Not on file   Highest education level: Doctorate  Occupational History   Occupation: cardiologist    Employer: [employer]  Tobacco Use   Smoking status: Never   Smokeless tobacco: Never  Vaping Use   Vaping status: Never Used  Substance and Sexual Activity   Alcohol use: Yes    Comment: 1-2 times per year   Drug use: Never   Sexual activity: Yes    Birth control/protection: I.U.D.    Comment: husband-vasectomy  Other Topics Concern   Not on file  Social  History Narrative   Cardiologist at [employer]   Financial Resource Strain: Low Risk  (04/05/2024)   Overall Financial Resource Strain (CARDIA)    Difficulty of Paying Living Expenses: Not hard at all  Food Insecurity: No Food Insecurity (04/05/2024)   Hunger Vital Sign    Worried About Running Out of Food in the Last Year: Never true    Ran Out of Food in the Last Year: Never true  Transportation Needs: No Transportation Needs (04/05/2024)   PRAPARE - Administrator, Civil Service (Medical): No    Lack of Transportation (Non-Medical): No  Physical Activity: Insufficiently Active (04/05/2024)   Exercise Vital Sign    Days of Exercise per Week: 2 days    Minutes of Exercise per Session: 60 min  Stress: Stress Concern Present (04/05/2024)   [employer] of Occupational Health - Occupational Stress Questionnaire    Feeling of Stress: Rather much  Social Connections: Moderately Integrated (04/05/2024)   Social Connection and Isolation Panel    Frequency of Communication with Friends and Family: Three times a week    Frequency of Social Gatherings with Friends and Family: Once a week    Attends Religious Services: More than 4 times per year    Active Member of  Clubs or Organizations: No    Attends Engineer, structural: Not on file    Marital Status: Married   Family History  Problem Relation Age of Onset   Early death Mother    Alcohol abuse Mother    COPD Mother    Depression Mother    Diabetes Mother    Hyperlipidemia Mother    Hypertension Mother    Mental illness Mother    Hypothyroidism Mother    Anxiety disorder Mother    Obesity Mother    Depression Father    Diabetes Father    Hearing loss Father    Hyperlipidemia Father    Hypertension Father    Colon polyps Father 2   Prostate cancer Father 11       prostate cancer   Cancer Father    Obesity Father    Depression Sister    Depression Brother    Learning  disabilities Daughter        Autism   ADD / ADHD Daughter    Breast cancer Maternal Aunt 60       negative genetic test   Cancer Maternal Aunt    Heart disease Maternal Grandmother    Breast cancer Maternal Grandmother 85       thyroid  ca, breast ca,squamous ca, basal cell ca, melanoma   Basal cell carcinoma Maternal Grandmother    Melanoma Maternal Grandmother 40   Cancer Maternal Grandmother    Alcohol abuse Maternal Grandfather    Diabetes Maternal Grandfather    Heart attack Maternal Grandfather    Heart disease Maternal Grandfather    Hyperlipidemia Maternal Grandfather    Hypertension Maternal Grandfather    Arthritis Paternal Grandmother    Hearing loss Paternal Grandmother    Heart attack Paternal Grandmother    Heart disease Paternal Grandmother    Hyperlipidemia Paternal Grandmother    Heart disease Paternal Grandfather    Hyperlipidemia Paternal Grandfather    Alzheimer's disease Paternal Grandfather    Depression Sister    Depression Brother    Breast cancer Maternal Aunt 61   Cancer Maternal Aunt    Pancreatic cancer Paternal Uncle 42   Breast cancer Maternal Aunt 47       negative genetic test   Breast cancer Maternal Great-grandmother 41   Cancer Maternal Aunt    Cancer Paternal Uncle    Allergies  Allergen Reactions   Wellbutrin [Bupropion] Hives and Rash   Current Outpatient Medications  Medication Sig Dispense Refill   Cholecalciferol (VITAMIN D -3 PO) Take 1 tablet by mouth daily.     levonorgestrel  (MIRENA ) 20 MCG/24HR IUD by Intrauterine route.     lisdexamfetamine (VYVANSE ) 50 MG capsule Take 1 capsule (50 mg total) by mouth in the morning. 30 capsule 0   montelukast  (SINGULAIR ) 10 MG tablet Take 1 tablet (10 mg total) by mouth daily. 90 tablet 3   tamoxifen  (NOLVADEX ) 10 MG tablet Take 1 tablet (10 mg total) by mouth daily. 90 tablet 3   tirzepatide  (ZEPBOUND ) 15 MG/0.5ML Pen Inject 15 mg into the skin once a week. 2 mL 3   No current  facility-administered medications for this visit.   No results found.  Review of Systems:   A ROS was performed including pertinent positives and negatives as documented in the HPI.  Physical Exam :   Constitutional: NAD and appears stated age Neurological: Alert and oriented Psych: Appropriate affect and cooperative Last menstrual period 03/22/2024.   Comprehensive Musculoskeletal Exam:  Left posterior shoulder has a soft mobile well capsulated mass involving the posterior axillary fold.  Otherwise distal neurosensory exam is intact   Imaging:     I personally reviewed and interpreted the radiographs.   Assessment and Plan:   43 y.o. 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  I did discuss treatment options.  She has continued to have irritation from this and as result would like it removed.  I did discuss the risks and limitations associated with this.  After discussion she would like to proceed  -Plan for left shoulder posterior lipoma removal   After a lengthy discussion of treatment options, including risks, benefits, alternatives, complications of surgical and nonsurgical conservative options, the patient elected surgical repair.   The patient  is aware of the material risks  and complications including, but not limited to injury to adjacent structures, neurovascular injury, infection, numbness, bleeding, implant failure, thermal burns, stiffness, persistent pain, failure to heal, disease transmission from allograft, need for further surgery, dislocation, anesthetic risks, blood clots, risks of death,and others. The probabilities of surgical success and failure discussed with patient given their particular co-morbidities.The time and nature of expected rehabilitation and recovery was discussed.The patient's questions were all answered preoperatively.  No barriers to understanding were noted. I explained the natural history of the disease  process and Rx rationale.  I explained to the patient what I considered to be reasonable expectations given their personal situation.  The final treatment plan was arrived at through a shared patient decision making process model.    I personally saw and evaluated the patient, and participated in the management and treatment plan.  Elspeth Parker, MD Attending Physician, Orthopedic Surgery  This document was dictated using Dragon voice recognition software. A reasonable attempt at proof reading has been made to minimize errors.

## 2024-04-12 ENCOUNTER — Encounter: Payer: Self-pay | Admitting: Internal Medicine

## 2024-04-12 ENCOUNTER — Ambulatory Visit: Attending: Internal Medicine | Admitting: Internal Medicine

## 2024-04-12 ENCOUNTER — Encounter (HOSPITAL_COMMUNITY): Payer: Self-pay

## 2024-04-12 VITALS — BP 104/70 | HR 73 | Ht 67.0 in | Wt 173.6 lb

## 2024-04-12 DIAGNOSIS — R079 Chest pain, unspecified: Secondary | ICD-10-CM | POA: Diagnosis not present

## 2024-04-12 DIAGNOSIS — R072 Precordial pain: Secondary | ICD-10-CM | POA: Diagnosis not present

## 2024-04-12 NOTE — Progress Notes (Signed)
 Cardiology Office Note:  .   Date:  04/12/2024  ID:  Stephanie Fields, DOB 1980/11/08, MRN [id number] PCP: Stephanie Jenkins Jansky, MD  Stephanie Fields Cardiologist:  Stephanie DELENA Merck, MD    History of Present Illness: .   Stephanie Fields is a 43 y.o. female.  Discussed the use of AI scribe software for clinical note transcription with the patient, who gave verbal consent to proceed.  History of Present Illness Dr. [name] is a 43 year old female with cardiovascular risk factors who presents with decreased exercise tolerance and dyspnea on exertion, with prior episode of chest pain.   On the evening of January 19th, 2025, she developed severe central chest pain rated 10 out of 10, localized to the lower epigastrium, non-radiating, and associated with nausea and vomiting. The pain occurred at rest and was not relieved by any measures. She had no prior episodes of similar chest pain. She presented to the emergency department after an hour without improvement. An EKG was performed with diffuse ST depressions, and high sensitivity troponin was checked and was unremarkable. A CT scan was performed and she was transferred for admission due to cholecystitis. She underwent percutaneous cholecystostomy tube placement and later a laparoscopic cholecystectomy in March 2025, with no further pain since the surgery.  Since the event in January, she reports decreased exercise tolerance and dyspnea on exertion. 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 However, she experiences shortness of breath and an elevated heart rate when hiking up steep inclines, particularly noted this past spring.   She has multiple risk factors for cardiovascular disease. See family history below. She and her siblings were diagnosed with elevated cholesterol in  childhood. She also has a history of gestational diabetes with two pregnancies and severe pre-eclampsia with her last pregnancy. Other past medical history notable for obesity, ADHD, high risk of future breast cancer on tamoxifen . Past surgical history notable for dental extractions prior to adulthood, laparoscopic cholecystectomy 10/2023, and pending L posterior shoulder lipoma removal.  Family history: father with nonobstructive CAD, hyperlipidemia, hypertension, type II diabetes, obesity. Deceased age 9 from metastatic recurrent prostate cancer (returned as small cell prostate cancer with paraneoplastic syndrome). Mother with hyperlipidemia, hypertension, type II diabetes, obesity. Deceased age 60, multiorgan system failure of unknown etiology. Paternal grandmother died in her early 59s of postoperative MI. Paternal uncle has CABG in his early 64s. Multiple other paternal family members with hyperlipidemia and hypertension. Maternal grandfather died age 69 after multiple sequential MI, had history of diabetes and hyperlipidemia. Multiple maternal aunts with hyperlipidemia, hypertension, obesity. Non CV FH notable for extensive cancer and mental health comorbidities on both sides of her family.   ROS: negative except per HPI above.  Past Medical History:  Diagnosis Date   ADHD (attention deficit hyperactivity disorder)    Allergy    Asthma    Exercise induced   Borderline hyperlipidemia    Diabetes mellitus without complication (HCC)    Gestational   Family history of basal cell carcinoma    Family history of breast cancer    Family history of melanoma    Family history of pancreatic cancer    Family history of prostate cancer    Headache    History of kidney stones    Hypertension    Pre eclampsia   Obesity    Social  History   Socioeconomic History   Marital status: Married    Spouse name: Not on file   Number of children: 4   Years of education: Not on file   Highest education  level: Doctorate  Occupational History   Occupation: cardiologist    Employer: [employer]  Tobacco Use   Smoking status: Never   Smokeless tobacco: Never  Vaping Use   Vaping status: Never Used  Substance and Sexual Activity   Alcohol use: Yes    Comment: 1-2 times per year   Drug use: Never   Sexual activity: Yes    Birth control/protection: I.U.D.    Comment: husband-vasectomy  Other Topics Concern   Not on file  Social History Narrative   Cardiologist at [employer]    Family History  Problem Relation Age of Onset   Early death Mother    Alcohol abuse Mother    COPD Mother    Depression Mother    Diabetes Mother    Hyperlipidemia Mother    Hypertension Mother    Mental illness Mother    Hypothyroidism Mother    Anxiety disorder Mother    Obesity Mother    Depression Father    Diabetes Father    Hearing loss Father    Hyperlipidemia Father    Hypertension Father    Colon polyps Father 58   Prostate cancer Father 51       prostate cancer   Cancer Father    Obesity Father    Depression Sister    Depression Brother    Learning disabilities Daughter        Autism   ADD / ADHD Daughter    Breast cancer Maternal Aunt 60       negative genetic test   Cancer Maternal Aunt    Heart disease Maternal Grandmother    Breast cancer Maternal Grandmother 72       thyroid  ca, breast ca,squamous ca, basal cell ca, melanoma   Basal cell carcinoma Maternal Grandmother    Melanoma Maternal Grandmother 59   Cancer Maternal Grandmother    Alcohol abuse Maternal Grandfather    Diabetes Maternal Grandfather    Heart attack Maternal Grandfather    Heart disease Maternal Grandfather    Hyperlipidemia Maternal Grandfather    Hypertension Maternal Grandfather    Arthritis Paternal Grandmother    Hearing loss Paternal Grandmother    Heart attack Paternal Grandmother    Heart disease Paternal Grandmother    Hyperlipidemia Paternal Grandmother    Heart disease Paternal  Grandfather    Hyperlipidemia Paternal Grandfather    Alzheimer's disease Paternal Grandfather    Depression Sister    Depression Brother    Breast cancer Maternal Aunt 75   Cancer Maternal Aunt    Pancreatic cancer Paternal Uncle 26   Breast cancer Maternal Aunt 47       negative genetic test   Breast cancer Maternal Great-grandmother 68   Cancer Maternal Aunt    Cancer Paternal Uncle     Studies Reviewed: SABRA   EKG Interpretation Date/Time:  Monday April 12 2024 11:17:29 EDT Ventricular Rate:  73 PR Interval:  132 QRS Duration:  92 QT Interval:  398 QTC Calculation: 438 R Axis:   72  Text Interpretation: Normal sinus rhythm Nonspecific T wave abnormality Confirmed by Loni Rushing ([id number]) on 04/12/2024 11:41:41 AM    Results LABS High sensitivity troponin: Unremarkable (09/14/2023)  RADIOLOGY Abdominal CT: Cholecystitis (09/14/2023)  DIAGNOSTIC REPORTS EKG: Diffuse ST depressions (  09/14/2023) Risk Assessment/Calculations:       Physical Exam:   VS:  BP 104/70 (BP Location: Left Arm, Patient Position: Sitting, Cuff Size: Normal)   Pulse 73   Ht 5' 7 (1.702 m)   Wt 173 lb 9.6 oz (78.7 kg)   LMP 03/22/2024 (Approximate)   SpO2 97%   BMI 27.19 kg/m    Wt Readings from Last 3 Encounters:  04/12/24 173 lb 9.6 oz (78.7 kg)  04/05/24 174 lb 2 oz (79 kg)  11/04/23 191 lb (86.6 kg)     Physical Exam GENERAL: Alert, cooperative, well developed, no acute distress HEENT: Normocephalic, normal oropharynx, moist mucous membranes CHEST: Clear to auscultation bilaterally, No wheezes, rhonchi, or crackles CARDIOVASCULAR: Normal heart rate and rhythm, S1 and S2 normal without murmurs ABDOMEN: Soft, non-tender, non-distended, without organomegaly, Normal bowel sounds EXTREMITIES: No cyanosis or edema NEUROLOGICAL: Cranial nerves grossly intact, Moves all extremities without gross motor or sensory deficit   ASSESSMENT AND PLAN: .    Assessment and Plan Assessment &  Plan  Chest pain Decreased exercise tolerance and dyspnea on exertion Abnormal EKG Family history of cardiovascular disease Initial episode of chest pain with abnormal EKG. Decreased exercise tolerance and dyspnea on exertion since January 2025, post-cholecystectomy and an episode of central chest pain. Symptoms include shortness of breath and tachycardia during activities like hiking. No prior similar symptoms. Family history of cardiovascular disease, hyperlipidemia, hypertension, and diabetes. - Order coronary CTA to evaluate source of chest pain and evaluate plaque burden.  - due to low blood pressure at baseline, will not provide pre-CT HR control. Can dose on scanner as needed.  HLD - LDL 136, not currently on lipid lowering therapy. Would consider crestor 5 mg daily if plaque or calcium present on CCTA. Will follow after scan.  -HDL mildly reduced, triglycerides normal. Will provide exercise recommendations after CCTA results.   Overweight - on tirzepatide  per primary with successful weight loss.       Stephanie Merck, MD, FACC

## 2024-04-12 NOTE — Patient Instructions (Addendum)
 Medication Instructions:  No Changes No Pre meds for Cardiac CT  Lab Work: None (had labs on 04/05/2024)  Testing/Procedures:   Your cardiac CT will be scheduled at one of the below locations:   [facility] [address] [place], [place] [address] [phone]  OR   [facility] [address] [place], [place] [address] [phone]  OR   [facility] [address]  [place], [place] [address]   If scheduled at [facility], please arrive at the [facility] En[facility] (Entrance C2) of [facility] 30 minutes prior to test start time. You can use the FREE valet parking offered at entrance C (encouraged to control the heart rate for the test)  Proceed to the [facility] Radiology Department (first floor) to check-in and test prep.  All radiology patients and guests should use entrance C2 at [facility], accessed from [facility], even though the hospital's physical address listed is [address]  If scheduled at the Heart and Vascular Tower at [address], please enter the parking lot using the [address] entrance and use the FREE valet service at the patient drop-off area. Enter the building and check-in with registration on the main floor.  If scheduled at [facility], please arrive 30 minutes early for check-in and test prep.  Please follow these instructions carefully (unless otherwise directed):  An IV will be required for this test and Nitroglycerin  will be given.  Hold all erectile dysfunction medications at least 3 days (72 hrs) prior to test. (Ie viagra, cialis, sildenafil, tadalafil, etc)   On the Night Before the Test: Be sure to Drink plenty of water . Do not consume any caffeinated/decaffeinated beverages or chocolate 12 hours prior to your test. Do not take any antihistamines 12 hours prior to your test.  On the Day of the  Test: Drink plenty of water  until 1 hour prior to the test. Do not eat any food 1 hour prior to test. You may take your regular medications prior to the test.  If you take Furosemide/Hydrochlorothiazide/Spironolactone/Chlorthalidone, please HOLD on the morning of the test. Patients who wear a continuous glucose monitor MUST remove the device prior to scanning. FEMALES- please wear underwire-free bra if available, avoid dresses & tight clothing  After the Test: Drink plenty of water . After receiving IV contrast, you may experience a mild flushed feeling. This is normal. On occasion, you may experience a mild rash up to 24 hours after the test. This is not dangerous. If this occurs, you can take Benadryl  25 mg, Zyrtec, Claritin, or Allegra and increase your fluid intake. (Patients taking Tikosyn should avoid Benadryl , and may take Zyrtec, Claritin, or Allegra) If you experience trouble breathing, this can be serious. If it is severe call 911 IMMEDIATELY. If it is mild, please call our office.  We will call to schedule your test 2-4 weeks out understanding that some insurance companies will need an authorization prior to the service being performed.   For more information and frequently asked questions, please visit our website : [email or web address]  For non-scheduling related questions, please contact the cardiac imaging nurse navigator should you have any questions/concerns: Cardiac Imaging Nurse Navigators Direct Office Dial: [phone]   For scheduling needs, including cancellations and rescheduling, please call [place], [phone].   Follow-Up: At [facility], you and your health needs are our priority.  As part of our continuing mission to  provide you with exceptional heart care, our providers are all part of one team.  This team includes your primary Cardiologist (physician) and Advanced Practice Providers or APPs (Physician Assistants and Nurse  Practitioners) who all work together to provide you with the care you need, when you need it.  Your next appointment:    TBD after testing  Provider:   Gayatri A Acharya, MD   Other Instructions Please call us  or send a MyChart message with any Cardiology related questions/concerns.  [phone].  Thank you!

## 2024-04-13 ENCOUNTER — Ambulatory Visit (HOSPITAL_COMMUNITY)
Admission: RE | Admit: 2024-04-13 | Discharge: 2024-04-13 | Disposition: A | Discharge status: Home / Self Care | Source: Ambulatory Visit | Attending: Internal Medicine | Admitting: Internal Medicine

## 2024-04-13 ENCOUNTER — Ambulatory Visit: Payer: Self-pay | Admitting: Internal Medicine

## 2024-04-13 DIAGNOSIS — R072 Precordial pain: Secondary | ICD-10-CM | POA: Diagnosis present

## 2024-04-13 MED ORDER — IOHEXOL 350 MG/ML SOLN
100.0000 mL | Freq: Once | INTRAVENOUS | Status: AC | PRN
  Administered 2024-04-13: 100 mL via INTRAVENOUS

## 2024-04-13 MED ORDER — NITROGLYCERIN 0.4 MG SL SUBL
0.8000 mg | SUBLINGUAL_TABLET | Freq: Once | SUBLINGUAL | Status: AC
  Administered 2024-04-13: 0.8 mg via SUBLINGUAL

## 2024-05-04 ENCOUNTER — Other Ambulatory Visit (HOSPITAL_BASED_OUTPATIENT_CLINIC_OR_DEPARTMENT_OTHER): Payer: Self-pay

## 2024-05-04 MED ORDER — LISDEXAMFETAMINE DIMESYLATE 50 MG PO CAPS
50.0000 mg | ORAL_CAPSULE | Freq: Every morning | ORAL | 0 refills | Status: AC
  Filled 2024-05-04: qty 30, 30d supply, fill #0

## 2024-05-06 ENCOUNTER — Other Ambulatory Visit: Payer: Self-pay

## 2024-05-06 ENCOUNTER — Other Ambulatory Visit (HOSPITAL_BASED_OUTPATIENT_CLINIC_OR_DEPARTMENT_OTHER): Payer: Self-pay

## 2024-05-06 MED ORDER — AMPHET-DEXTROAMPHET 3-BEAD ER 25 MG PO CP24
25.0000 mg | ORAL_CAPSULE | Freq: Every morning | ORAL | 0 refills | Status: AC
  Filled 2024-05-06: qty 30, 30d supply, fill #0

## 2024-05-07 ENCOUNTER — Other Ambulatory Visit (HOSPITAL_BASED_OUTPATIENT_CLINIC_OR_DEPARTMENT_OTHER): Payer: Self-pay

## 2024-05-08 ENCOUNTER — Other Ambulatory Visit (HOSPITAL_BASED_OUTPATIENT_CLINIC_OR_DEPARTMENT_OTHER): Payer: Self-pay

## 2024-05-20 ENCOUNTER — Other Ambulatory Visit (HOSPITAL_BASED_OUTPATIENT_CLINIC_OR_DEPARTMENT_OTHER): Payer: Self-pay

## 2024-05-20 ENCOUNTER — Other Ambulatory Visit (HOSPITAL_BASED_OUTPATIENT_CLINIC_OR_DEPARTMENT_OTHER): Payer: Self-pay | Admitting: Orthopaedic Surgery

## 2024-05-20 ENCOUNTER — Encounter (HOSPITAL_BASED_OUTPATIENT_CLINIC_OR_DEPARTMENT_OTHER): Payer: Self-pay | Admitting: Orthopaedic Surgery

## 2024-05-20 DIAGNOSIS — D1722 Benign lipomatous neoplasm of skin and subcutaneous tissue of left arm: Secondary | ICD-10-CM | POA: Diagnosis not present

## 2024-05-20 MED ORDER — OXYCODONE HCL 5 MG PO TABS
5.0000 mg | ORAL_TABLET | ORAL | 0 refills | Status: AC | PRN
  Filled 2024-05-20: qty 10, 2d supply, fill #0

## 2024-05-20 MED ORDER — IBUPROFEN 800 MG PO TABS
800.0000 mg | ORAL_TABLET | Freq: Three times a day (TID) | ORAL | 0 refills | Status: AC
  Filled 2024-05-20: qty 30, 10d supply, fill #0

## 2024-05-20 MED ORDER — ACETAMINOPHEN 500 MG PO TABS
500.0000 mg | ORAL_TABLET | Freq: Three times a day (TID) | ORAL | 0 refills | Status: AC
  Filled 2024-05-20: qty 30, 10d supply, fill #0

## 2024-05-20 NOTE — Progress Notes (Signed)
   Date of Surgery: 05/20/2024  INDICATIONS: Stephanie Fields is a 43 y.o.-year-old female with left shoulder posterior lipoma.  The risk and benefits of the procedure were discussed in detail and documented in the pre-operative evaluation.   PREOPERATIVE DIAGNOSIS: 1.  Left shoulder posterior lipoma  POSTOPERATIVE DIAGNOSIS: Same.  PROCEDURE: 1.  Left shoulder posterior lipoma removal  SURGEON: Elspeth LITTIE Parker MD  ASSISTANT: Conley Dawson, ATC  ANESTHESIA:  general  IV FLUIDS AND URINE: See anesthesia record.  ANTIBIOTICS: Ancef  ESTIMATED BLOOD LOSS: 5 mL.  IMPLANTS:  * No surgical log found *  DRAINS: None  CULTURES: None  COMPLICATIONS: none  DESCRIPTION OF PROCEDURE:   The patient was seen and greeted in the preoperative holding area.  The correct site was marked per universal protocol with nursing.  Anesthesia provided antibiotics 1 hour prior to skin incision.  She is subsequently taken back to the operating room.  At this time anesthesia was induced and she was transferred over the operating room table.  She was placed on the beanbag in the sloppy lateral position.  All bony prominences were padded.  She was prepped and draped in usual sterile fashion.  Final timeout was performed.  A direct posterior approach was made to the posterior shoulder.  15 blade was used to incise through skin.  At this time Metzenbaums were used to dissect around the lipoma.  This was encapsulated.  As a result this was squeezed circumferentially out of the incision which came through nicely without any remaining tissue.  The wound was thoroughly irrigated and closed in layers of 2-0 Vicryl and 3-0 Monocryl.  Soft dressing was applied with Xeroform gauze and Tegaderm. counts were correct at the end of the case.  She is taken the PACU without complication.      POSTOPERATIVE PLAN: She will be weightbearing and activity as tolerated.  She will be up and active with early active range of motion  as the DVT prophylaxis.  Elspeth LITTIE Parker, MD 2:54 PM

## 2024-06-02 ENCOUNTER — Encounter (HOSPITAL_BASED_OUTPATIENT_CLINIC_OR_DEPARTMENT_OTHER): Admitting: Orthopaedic Surgery

## 2024-06-11 ENCOUNTER — Other Ambulatory Visit (HOSPITAL_BASED_OUTPATIENT_CLINIC_OR_DEPARTMENT_OTHER): Payer: Self-pay

## 2024-06-11 MED ORDER — AMPHET-DEXTROAMPHET 3-BEAD ER 50 MG PO CP24
50.0000 mg | ORAL_CAPSULE | Freq: Every morning | ORAL | 0 refills | Status: AC
  Filled 2024-06-11: qty 90, 90d supply, fill #0

## 2024-06-14 ENCOUNTER — Other Ambulatory Visit (HOSPITAL_BASED_OUTPATIENT_CLINIC_OR_DEPARTMENT_OTHER): Payer: Self-pay

## 2024-06-28 ENCOUNTER — Encounter: Payer: Self-pay | Admitting: Radiology

## 2024-08-16 ENCOUNTER — Other Ambulatory Visit (HOSPITAL_BASED_OUTPATIENT_CLINIC_OR_DEPARTMENT_OTHER): Payer: Self-pay | Admitting: Family Medicine

## 2024-08-16 DIAGNOSIS — Z1231 Encounter for screening mammogram for malignant neoplasm of breast: Secondary | ICD-10-CM

## 2024-08-17 ENCOUNTER — Ambulatory Visit (HOSPITAL_BASED_OUTPATIENT_CLINIC_OR_DEPARTMENT_OTHER)
Admission: RE | Admit: 2024-08-17 | Discharge: 2024-08-17 | Disposition: A | Discharge status: Home / Self Care | Source: Ambulatory Visit | Attending: Family Medicine | Admitting: Family Medicine

## 2024-08-17 ENCOUNTER — Encounter (HOSPITAL_BASED_OUTPATIENT_CLINIC_OR_DEPARTMENT_OTHER): Payer: Self-pay | Admitting: Radiology

## 2024-08-17 DIAGNOSIS — Z1231 Encounter for screening mammogram for malignant neoplasm of breast: Secondary | ICD-10-CM | POA: Diagnosis not present

## 2024-08-23 ENCOUNTER — Telehealth: Payer: Self-pay | Admitting: Radiology

## 2024-08-23 NOTE — Telephone Encounter (Signed)
 MM DIGITAL SCREENING BILAT W/ TOMO AND CAD marked to review ASAP.

## 2024-08-24 ENCOUNTER — Ambulatory Visit: Payer: Self-pay | Admitting: Family Medicine

## 2024-09-28 ENCOUNTER — Other Ambulatory Visit (HOSPITAL_BASED_OUTPATIENT_CLINIC_OR_DEPARTMENT_OTHER): Payer: Self-pay

## 2024-09-28 ENCOUNTER — Other Ambulatory Visit: Payer: Self-pay

## 2024-09-28 MED ORDER — AMPHET-DEXTROAMPHET 3-BEAD ER 50 MG PO CP24
50.0000 mg | ORAL_CAPSULE | Freq: Every morning | ORAL | 0 refills | Status: AC
  Filled 2024-09-28: qty 90, 90d supply, fill #0

## 2024-10-01 ENCOUNTER — Other Ambulatory Visit: Payer: Self-pay
